# Patient Record
Sex: Female | Born: 1955 | Hispanic: No | Marital: Married | Smoking: Former smoker
Health system: Southern US, Community
[De-identification: ages and names within clinical notes are randomized; demographics above are authoritative.]

## PROBLEM LIST (undated history)

## (undated) DIAGNOSIS — F329 Major depressive disorder, single episode, unspecified: Secondary | ICD-10-CM

## (undated) DIAGNOSIS — F32A Depression, unspecified: Secondary | ICD-10-CM

## (undated) DIAGNOSIS — K219 Gastro-esophageal reflux disease without esophagitis: Secondary | ICD-10-CM

## (undated) DIAGNOSIS — E039 Hypothyroidism, unspecified: Secondary | ICD-10-CM

## (undated) HISTORY — DX: Gastro-esophageal reflux disease without esophagitis: K21.9

## (undated) HISTORY — DX: Depression, unspecified: F32.A

## (undated) HISTORY — DX: Major depressive disorder, single episode, unspecified: F32.9

## (undated) HISTORY — DX: Hypothyroidism, unspecified: E03.9

## (undated) HISTORY — PX: CHOLECYSTECTOMY: SHX55

---

## 1997-08-17 ENCOUNTER — Other Ambulatory Visit: Admission: RE | Admit: 1997-08-17 | Discharge: 1997-08-17 | Payer: Self-pay | Admitting: Obstetrics and Gynecology

## 2005-07-11 ENCOUNTER — Other Ambulatory Visit: Admission: RE | Admit: 2005-07-11 | Discharge: 2005-07-11 | Payer: Self-pay | Admitting: Family Medicine

## 2011-12-15 ENCOUNTER — Other Ambulatory Visit: Payer: Self-pay | Admitting: Otolaryngology

## 2011-12-15 DIAGNOSIS — H903 Sensorineural hearing loss, bilateral: Secondary | ICD-10-CM

## 2011-12-15 DIAGNOSIS — H905 Unspecified sensorineural hearing loss: Secondary | ICD-10-CM

## 2012-12-09 ENCOUNTER — Other Ambulatory Visit: Payer: Self-pay | Admitting: Nurse Practitioner

## 2012-12-11 NOTE — Telephone Encounter (Signed)
Last seen 11/18/11 MMM

## 2012-12-31 ENCOUNTER — Encounter: Payer: Self-pay | Admitting: Nurse Practitioner

## 2012-12-31 ENCOUNTER — Ambulatory Visit (INDEPENDENT_AMBULATORY_CARE_PROVIDER_SITE_OTHER): Payer: BC Managed Care – PPO | Admitting: Nurse Practitioner

## 2012-12-31 VITALS — BP 116/64 | HR 72 | Temp 97.8°F | Ht 62.5 in | Wt 155.0 lb

## 2012-12-31 DIAGNOSIS — K219 Gastro-esophageal reflux disease without esophagitis: Secondary | ICD-10-CM

## 2012-12-31 DIAGNOSIS — Z0000 Encounter for general adult medical examination without abnormal findings: Secondary | ICD-10-CM

## 2012-12-31 DIAGNOSIS — F32A Depression, unspecified: Secondary | ICD-10-CM | POA: Insufficient documentation

## 2012-12-31 DIAGNOSIS — N390 Urinary tract infection, site not specified: Secondary | ICD-10-CM

## 2012-12-31 DIAGNOSIS — R609 Edema, unspecified: Secondary | ICD-10-CM

## 2012-12-31 DIAGNOSIS — F411 Generalized anxiety disorder: Secondary | ICD-10-CM

## 2012-12-31 DIAGNOSIS — F329 Major depressive disorder, single episode, unspecified: Secondary | ICD-10-CM | POA: Insufficient documentation

## 2012-12-31 DIAGNOSIS — Z01419 Encounter for gynecological examination (general) (routine) without abnormal findings: Secondary | ICD-10-CM

## 2012-12-31 DIAGNOSIS — Z124 Encounter for screening for malignant neoplasm of cervix: Secondary | ICD-10-CM

## 2012-12-31 LAB — POCT UA - MICROSCOPIC ONLY
Mucus, UA: NEGATIVE
Yeast, UA: NEGATIVE

## 2012-12-31 LAB — POCT URINALYSIS DIPSTICK
Nitrite, UA: NEGATIVE
Urobilinogen, UA: NEGATIVE
pH, UA: 5

## 2012-12-31 LAB — POCT CBC
HCT, POC: 42.5 % (ref 37.7–47.9)
MCHC: 33.1 g/dL (ref 31.8–35.4)
MCV: 90.7 fL (ref 80–97)
POC Granulocyte: 5.7 (ref 2–6.9)
RDW, POC: 12.9 %
WBC: 0.7 10*3/uL — AB (ref 4.6–10.2)

## 2012-12-31 MED ORDER — NITROFURANTOIN MONOHYD MACRO 100 MG PO CAPS: 100.0000 mg | ORAL_CAPSULE | Freq: Two times a day (BID) | ORAL | Status: DC

## 2012-12-31 MED ORDER — SPIRONOLACTONE 25 MG PO TABS: 25.0000 mg | ORAL_TABLET | Freq: Every day | ORAL | Status: DC

## 2012-12-31 MED ORDER — BUPROPION HCL ER (SR) 150 MG PO TB12: 150.0000 mg | ORAL_TABLET | Freq: Every day | ORAL | Status: DC

## 2012-12-31 MED ORDER — OMEPRAZOLE 20 MG PO CPDR: 20.0000 mg | DELAYED_RELEASE_CAPSULE | Freq: Every day | ORAL | Status: DC

## 2012-12-31 MED ORDER — CITALOPRAM HYDROBROMIDE 40 MG PO TABS: 40.0000 mg | ORAL_TABLET | Freq: Every day | ORAL | Status: DC

## 2012-12-31 NOTE — Progress Notes (Signed)
Subjective:    Patient ID: Candace Torres, female    DOB: 1955/06/09, 57 y.o.   MRN: [id number]  HPI Patient here today for CPE and PAP- She is doing quite well and has no complaints today. Patient Active Problem List   Diagnosis Date Noted  . GERD (gastroesophageal reflux disease) 12/31/2012  . Depression 12/31/2012  . GAD (generalized anxiety disorder) 12/31/2012  . Peripheral edema 12/31/2012   Outpatient Encounter Prescriptions as of 12/31/2012  Medication Sig Dispense Refill  . buPROPion (WELLBUTRIN SR) 150 MG 12 hr tablet Take 150 mg by mouth daily.      . citalopram (CELEXA) 40 MG tablet Take 40 mg by mouth daily.      Marland Kitchen omeprazole (PRILOSEC) 20 MG capsule Take 20 mg by mouth daily.      Marland Kitchen spironolactone (ALDACTONE) 25 MG tablet TAKE ONE TABLET BY MOUTH TWICE DAILY  60 tablet  0   No facility-administered encounter medications on file as of 12/31/2012.       Review of Systems  Constitutional: Negative.   HENT: Negative.   Eyes: Negative.   Respiratory: Negative.   Cardiovascular: Negative.   Gastrointestinal: Negative.   Endocrine: Negative.   Genitourinary: Negative.   Musculoskeletal: Negative.   Neurological: Negative.   Hematological: Negative.   Psychiatric/Behavioral: Negative.   All other systems reviewed and are negative.       Objective:   Physical Exam  Constitutional: She is oriented to person, place, and time. She appears well-developed and well-nourished.  HENT:  Head: Normocephalic.  Right Ear: Hearing, tympanic membrane, external ear and ear canal normal.  Left Ear: Hearing, tympanic membrane, external ear and ear canal normal.  Nose: Nose normal.  Mouth/Throat: Uvula is midline and oropharynx is clear and moist.  Eyes: Conjunctivae and EOM are normal. Pupils are equal, round, and reactive to light.  Neck: Normal range of motion and full passive range of motion without pain. Neck supple. No JVD present. Carotid bruit is not present. No mass and  no thyromegaly present.  Cardiovascular: Normal rate, normal heart sounds and intact distal pulses.   No murmur heard. Pulmonary/Chest: Effort normal and breath sounds normal. Right breast exhibits no inverted nipple, no mass, no nipple discharge, no skin change and no tenderness. Left breast exhibits no inverted nipple, no mass, no nipple discharge, no skin change and no tenderness. Breasts are symmetrical.  Abdominal: Soft. Bowel sounds are normal. She exhibits no mass. There is no tenderness.  Genitourinary: Vagina normal and uterus normal. Guaiac negative stool. No breast swelling, tenderness, discharge or bleeding.  bimanual exam-No adnexal masses or tenderness.  Cervix parous and pink- no discharge  Musculoskeletal: Normal range of motion.  Lymphadenopathy:    She has no cervical adenopathy.  Neurological: She is alert and oriented to person, place, and time.  Skin: Skin is warm and dry.  Psychiatric: She has a normal mood and affect. Her behavior is normal. Judgment and thought content normal.   BP 116/64  Pulse 72  Temp(Src) 97.8 F (36.6 C) (Oral)  Ht 5' 2.5" (1.588 m)  Wt 155 lb (70.308 kg)  BMI 27.88 kg/m2  .      Assessment & Plan:   1. Annual physical exam   2. GERD (gastroesophageal reflux disease)   3. Depression   4. GAD (generalized anxiety disorder)   5. Peripheral edema   6. Encounter for routine gynecological examination   7. UTI (urinary tract infection)    Orders Placed This Encounter  Procedures  . Urine culture  . CMP14+EGFR  . NMR, lipoprofile  . Thyroid Panel With TSH  . Vit D  25 hydroxy (rtn osteoporosis monitoring)  . Urinalysis Dipstick  . POCT CBC  . POCT UA - Microscopic Only   Meds ordered this encounter  Medications  . DISCONTD: buPROPion (WELLBUTRIN SR) 150 MG 12 hr tablet    Sig: Take 150 mg by mouth daily.  Marland Kitchen DISCONTD: citalopram (CELEXA) 40 MG tablet    Sig: Take 40 mg by mouth daily.  Marland Kitchen DISCONTD: omeprazole (PRILOSEC) 20 MG  capsule    Sig: Take 20 mg by mouth daily.  Marland Kitchen spironolactone (ALDACTONE) 25 MG tablet    Sig: Take 1 tablet (25 mg total) by mouth daily.    Dispense:  30 tablet    Refill:  5    Order Specific Question:  Supervising Provider    Answer:  Ernestina Penna [[id number]]  . citalopram (CELEXA) 40 MG tablet    Sig: Take 1 tablet (40 mg total) by mouth daily.    Dispense:  30 tablet    Refill:  5    Order Specific Question:  Supervising Provider    Answer:  Ernestina Penna [[id number]]  . omeprazole (PRILOSEC) 20 MG capsule    Sig: Take 1 capsule (20 mg total) by mouth daily.    Dispense:  90 capsule    Refill:  1    Order Specific Question:  Supervising Provider    Answer:  Ernestina Penna [[id number]]  . buPROPion (WELLBUTRIN SR) 150 MG 12 hr tablet    Sig: Take 1 tablet (150 mg total) by mouth daily.    Dispense:  30 tablet    Refill:  5    Order Specific Question:  Supervising Provider    Answer:  Ernestina Penna [[id number]]  . nitrofurantoin, macrocrystal-monohydrate, (MACROBID) 100 MG capsule    Sig: Take 1 capsule (100 mg total) by mouth 2 (two) times daily.    Dispense:  14 capsule    Refill:  0    Order Specific Question:  Supervising Provider    Answer:  Deborra Medina   Force fluids AZO OTC if helps Continue all meds Labs pending Diet and exercise encouraged Health maintenance reviewed Follow up in 6 months  Mary-Margaret Daphine Deutscher, FNP

## 2012-12-31 NOTE — Patient Instructions (Signed)

## 2013-01-01 LAB — NMR, LIPOPROFILE
HDL Particle Number: 28.7 umol/L — ABNORMAL LOW (ref >=30.5)
LDLC SERPL CALC-MCNC: 110 mg/dL — ABNORMAL HIGH (ref <100)
LP-IR Score: 51 — ABNORMAL HIGH (ref <=45)

## 2013-01-01 LAB — THYROID PANEL WITH TSH: TSH: 1.9 u[IU]/mL (ref 0.450–4.500)

## 2013-01-01 LAB — CMP14+EGFR
ALT: 41 IU/L — ABNORMAL HIGH (ref 0–32)
AST: 27 IU/L (ref 0–40)
Alkaline Phosphatase: 73 IU/L (ref 39–117)
BUN/Creatinine Ratio: 14 (ref 9–23)
CO2: 22 mmol/L (ref 18–29)
Chloride: 101 mmol/L (ref 97–108)
Creatinine, Ser: 1.18 mg/dL — ABNORMAL HIGH (ref 0.57–1.00)
Globulin, Total: 2.2 g/dL (ref 1.5–4.5)
Potassium: 4.4 mmol/L (ref 3.5–5.2)
Sodium: 141 mmol/L (ref 134–144)

## 2013-01-03 LAB — PAP IG W/ RFLX HPV ASCU: PAP Smear Comment: 0

## 2013-01-05 LAB — URINE CULTURE

## 2013-06-27 ENCOUNTER — Encounter: Payer: Self-pay | Admitting: Physician Assistant

## 2013-06-27 ENCOUNTER — Ambulatory Visit (INDEPENDENT_AMBULATORY_CARE_PROVIDER_SITE_OTHER): Payer: BC Managed Care – PPO | Admitting: Physician Assistant

## 2013-06-27 VITALS — BP 119/61 | HR 90 | Temp 99.5°F | Ht 62.5 in | Wt 158.0 lb

## 2013-06-27 DIAGNOSIS — R21 Rash and other nonspecific skin eruption: Secondary | ICD-10-CM

## 2013-06-27 DIAGNOSIS — W57XXXA Bitten or stung by nonvenomous insect and other nonvenomous arthropods, initial encounter: Secondary | ICD-10-CM

## 2013-06-27 DIAGNOSIS — T148 Other injury of unspecified body region: Secondary | ICD-10-CM

## 2013-06-27 MED ORDER — TRIAMCINOLONE ACETONIDE 0.1 % EX CREA: 1.0000 "application " | TOPICAL_CREAM | Freq: Two times a day (BID) | CUTANEOUS | Status: DC

## 2013-06-27 MED ORDER — HYDROXYZINE HCL 25 MG PO TABS: ORAL_TABLET | ORAL | Status: DC

## 2013-06-27 MED ORDER — DOXYCYCLINE HYCLATE 100 MG PO TABS: 100.0000 mg | ORAL_TABLET | Freq: Two times a day (BID) | ORAL | Status: DC

## 2013-07-12 NOTE — Progress Notes (Signed)
   Subjective:    Patient ID: Candace Torres, female    DOB: [birth year], 58 y.o.   MRN: [id number]  HPI 58 y/o female presents with c/o intermittently pruritic rash that began yesterday after removing a tick from her abdomen. Rash is primarily on abdomen and flank region, chest and BUE. Has tried Benadryl and Claritin with no relief. Denies change in detergent, soap, lotion, fabric softener.     Review of Systems Denies CP, SOB, n/v, diarrhea, headache, dizziness, tingling or pain, fever, blurred/double vision Positive for pruritis, rash on trunk and BUE x 1 day    Objective:   Physical Exam - A &O x 3 - erythematous macular rash primarily on right abdomen/flank, BUE and chest.  Skin is dry and flaking. Small raised erythematous papule where tick was removed on abdomen. No streaking or TTP. - Patient recently had skin cancer treated on her leg and has been taking Bactrim q day.          Assessment & Plan:  1. Rash secondary to medication or tick bite: Advised patient to stop taking remaining Bactrim. Prescribed Doxycycline 100 mg BID x 14 days to cover possible.RMSF or [place]. TAC .1% for pruritis. Take Zyrtec 10mg  in am. Hydroxyzine 25mg  at bedtime if needed for pruritis. Use dove soap. RTC if s/s worsen or do not improve.

## 2013-07-13 DIAGNOSIS — R21 Rash and other nonspecific skin eruption: Secondary | ICD-10-CM | POA: Insufficient documentation

## 2013-08-05 ENCOUNTER — Other Ambulatory Visit: Payer: Self-pay | Admitting: Nurse Practitioner

## 2013-12-10 DIAGNOSIS — Z0289 Encounter for other administrative examinations: Secondary | ICD-10-CM

## 2014-01-03 LAB — HM DEXA SCAN: HM Dexa Scan: NORMAL

## 2014-01-08 ENCOUNTER — Ambulatory Visit (INDEPENDENT_AMBULATORY_CARE_PROVIDER_SITE_OTHER): Payer: BC Managed Care – PPO

## 2014-01-08 ENCOUNTER — Encounter: Payer: BC Managed Care – PPO | Admitting: Nurse Practitioner

## 2014-01-08 ENCOUNTER — Encounter: Payer: Self-pay | Admitting: Nurse Practitioner

## 2014-01-08 ENCOUNTER — Encounter (INDEPENDENT_AMBULATORY_CARE_PROVIDER_SITE_OTHER): Payer: Self-pay

## 2014-01-08 ENCOUNTER — Ambulatory Visit (INDEPENDENT_AMBULATORY_CARE_PROVIDER_SITE_OTHER): Payer: BC Managed Care – PPO | Admitting: Nurse Practitioner

## 2014-01-08 VITALS — BP 116/68 | HR 81 | Temp 98.7°F | Ht 62.5 in | Wt 157.0 lb

## 2014-01-08 DIAGNOSIS — F329 Major depressive disorder, single episode, unspecified: Secondary | ICD-10-CM

## 2014-01-08 DIAGNOSIS — Z0000 Encounter for general adult medical examination without abnormal findings: Secondary | ICD-10-CM

## 2014-01-08 DIAGNOSIS — K219 Gastro-esophageal reflux disease without esophagitis: Secondary | ICD-10-CM

## 2014-01-08 DIAGNOSIS — F411 Generalized anxiety disorder: Secondary | ICD-10-CM

## 2014-01-08 DIAGNOSIS — L658 Other specified nonscarring hair loss: Secondary | ICD-10-CM

## 2014-01-08 DIAGNOSIS — R300 Dysuria: Secondary | ICD-10-CM

## 2014-01-08 DIAGNOSIS — Z23 Encounter for immunization: Secondary | ICD-10-CM

## 2014-01-08 DIAGNOSIS — R609 Edema, unspecified: Secondary | ICD-10-CM

## 2014-01-08 DIAGNOSIS — B3749 Other urogenital candidiasis: Secondary | ICD-10-CM

## 2014-01-08 DIAGNOSIS — N3000 Acute cystitis without hematuria: Secondary | ICD-10-CM

## 2014-01-08 DIAGNOSIS — F32A Depression, unspecified: Secondary | ICD-10-CM

## 2014-01-08 LAB — POCT URINALYSIS DIPSTICK
BILIRUBIN UA: NEGATIVE
Glucose, UA: NEGATIVE
Ketones, UA: NEGATIVE
Nitrite, UA: NEGATIVE
PH UA: 6.5
Spec Grav, UA: 1.015
Urobilinogen, UA: NEGATIVE

## 2014-01-08 LAB — POCT CBC
Granulocyte percent: 66.3 %G (ref 37–80)
HEMATOCRIT: 43.2 % (ref 37.7–47.9)
Hemoglobin: 13.9 g/dL (ref 12.2–16.2)
Lymph, poc: 2.3 (ref 0.6–3.4)
MCH, POC: 29.8 pg (ref 27–31.2)
MCHC: 32.3 g/dL (ref 31.8–35.4)
MCV: 92.4 fL (ref 80–97)
MPV: 8 fL (ref 0–99.8)
POC GRANULOCYTE: 6.2 (ref 2–6.9)
POC LYMPH PERCENT: 25.2 %L (ref 10–50)
Platelet Count, POC: 259 10*3/uL (ref 142–424)
RBC: 4.7 M/uL (ref 4.04–5.48)
RDW, POC: 12.4 %
WBC: 9.3 10*3/uL (ref 4.6–10.2)

## 2014-01-08 LAB — POCT UA - MICROSCOPIC ONLY
Casts, Ur, LPF, POC: NEGATIVE
Crystals, Ur, HPF, POC: NEGATIVE
MUCUS UA: NEGATIVE
Yeast, UA: NEGATIVE

## 2014-01-08 MED ORDER — SPIRONOLACTONE 25 MG PO TABS: 25.0000 mg | ORAL_TABLET | Freq: Every day | ORAL | Status: DC

## 2014-01-08 MED ORDER — PAROXETINE HCL ER 25 MG PO TB24: 25.0000 mg | ORAL_TABLET | Freq: Every day | ORAL | Status: DC

## 2014-01-08 MED ORDER — BUPROPION HCL ER (SR) 150 MG PO TB12: 150.0000 mg | ORAL_TABLET | Freq: Every day | ORAL | Status: DC

## 2014-01-08 MED ORDER — OMEPRAZOLE 20 MG PO CPDR: DELAYED_RELEASE_CAPSULE | ORAL | Status: DC

## 2014-01-08 MED ORDER — SULFAMETHOXAZOLE-TMP DS 800-160 MG PO TABS: 1.0000 | ORAL_TABLET | Freq: Two times a day (BID) | ORAL | Status: DC

## 2014-01-08 NOTE — Progress Notes (Addendum)
Subjective:    Patient ID: Candace Torres, female    DOB: [birth year], 58 y.o.   MRN: [id number]  HPI Patient here today for annual physical exam- she is doing well today. Her only complaint is of dysuria that started couple of days ago. Denies fever or nocturia. Hair loss Currently takes spirolactone which helps with her hair loss GERD Omeprazole working well- no symptoms when she takes meds Depression/GAD Paxil and wellbutrin combinations working well- keeps her calm and from worrying so much  Review of Systems  Constitutional: Negative.   HENT: Negative.   Respiratory: Negative.   Cardiovascular: Negative.   Gastrointestinal: Negative.   Genitourinary: Positive for dysuria, urgency and frequency. Negative for pelvic pain.  Neurological: Negative.   Psychiatric/Behavioral: Negative.   All other systems reviewed and are negative.      Objective:   Physical Exam  Constitutional: She is oriented to person, place, and time. She appears well-developed and well-nourished.  HENT:  Head: Normocephalic.  Right Ear: Hearing, tympanic membrane, external ear and ear canal normal.  Left Ear: Hearing, tympanic membrane, external ear and ear canal normal.  Nose: Nose normal.  Mouth/Throat: Uvula is midline and oropharynx is clear and moist.  Eyes: Conjunctivae and EOM are normal. Pupils are equal, round, and reactive to light.  Neck: Normal range of motion and full passive range of motion without pain. Neck supple. No JVD present. Carotid bruit is not present. No mass and no thyromegaly present.  Cardiovascular: Normal rate, normal heart sounds and intact distal pulses.   No murmur heard. Pulmonary/Chest: Effort normal and breath sounds normal.  Abdominal: Soft. Bowel sounds are normal. She exhibits no mass. There is no tenderness.  Genitourinary: No breast swelling, tenderness, discharge or bleeding.  Musculoskeletal: Normal range of motion.  Lymphadenopathy:    She has no cervical  adenopathy.  Neurological: She is alert and oriented to person, place, and time.  Skin: Skin is warm and dry.  Psychiatric: She has a normal mood and affect. Her behavior is normal. Judgment and thought content normal.   BP 116/68  Pulse 81  Temp(Src) 98.7 F (37.1 C) (Oral)  Ht 5' 2.5" (1.588 m)  Wt 157 lb (71.215 kg)  BMI 28.24 kg/m2  Results for orders placed in visit on 01/08/14  HM DEXA SCAN      Result Value Ref Range   HM Dexa Scan normal    POCT URINALYSIS DIPSTICK      Result Value Ref Range   Color, UA gold     Clarity, UA clear     Glucose, UA neg     Bilirubin, UA neg     Ketones, UA neg     Spec Grav, UA 1.015     Blood, UA large     pH, UA 6.5     Protein, UA 2+     Urobilinogen, UA negative     Nitrite, UA neg     Leukocytes, UA large (3+)            Assessment & Plan:  1. Dysuria - POCT UA - Microscopic Only - POCT urinalysis dipstick  2. Peripheral edema  3. Gastroesophageal reflux disease without esophagitis - omeprazole (PRILOSEC) 20 MG capsule; TAKE ONE CAPSULE BY MOUTH ONE TIME DAILY  Dispense: 90 capsule; Refill: 1  4. GAD (generalized anxiety disorder)  5. Depression - PARoxetine (PAXIL-CR) 25 MG 24 hr tablet; Take 1 tablet (25 mg total) by mouth daily.  Dispense: 90 tablet;  Refill: 3 - buPROPion (WELLBUTRIN SR) 150 MG 12 hr tablet; Take 1 tablet (150 mg total) by mouth daily.  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  Dispense: 90 tablet; Refill: 1  7. Annual physical exam - DG Chest 2 View; Future - EKG 12-Lead - POCT CBC - CMP14+EGFR - NMR, lipoprofile - Thyroid Panel With TSH - Vit D  25 hydroxy (rtn osteoporosis monitoring)  8. Acute cystitis without hematuria - sulfamethoxazole-trimethoprim (BACTRIM DS) 800-160 MG per tablet; Take 1 tablet by mouth 2 (two) times daily.  Dispense: 20 tablet; Refill: 0    Labs  pending Health maintenance reviewed Diet and exercise encouraged Continue all meds Follow up  In 3 month   [place], FNP

## 2014-01-08 NOTE — Addendum Note (Signed)
Addended by: Selmer Dominion on: 01/08/2014 12:03 PM   Modules accepted: Orders

## 2014-01-09 LAB — CMP14+EGFR
A/G RATIO: 2 (ref 1.1–2.5)
ALBUMIN: 4.7 g/dL (ref 3.6–4.8)
ALT: 37 IU/L — AB (ref 0–32)
AST: 31 IU/L (ref 0–40)
Alkaline Phosphatase: 86 IU/L (ref 39–117)
BILIRUBIN TOTAL: 0.6 mg/dL (ref 0.0–1.2)
BUN/Creatinine Ratio: 13 (ref 11–26)
BUN: 16 mg/dL (ref 8–27)
CALCIUM: 9.7 mg/dL (ref 8.7–10.3)
CO2: 22 mmol/L (ref 18–29)
CREATININE: 1.23 mg/dL — AB (ref 0.57–1.00)
Chloride: 101 mmol/L (ref 97–108)
GFR calc non Af Amer: 48 mL/min/{1.73_m2} — ABNORMAL LOW (ref >59)
GFR, EST AFRICAN AMERICAN: 55 mL/min/{1.73_m2} — AB (ref >59)
GLOBULIN, TOTAL: 2.4 g/dL (ref 1.5–4.5)
GLUCOSE: 97 mg/dL (ref 65–99)
Potassium: 4.7 mmol/L (ref 3.5–5.2)
Sodium: 141 mmol/L (ref 134–144)
Total Protein: 7.1 g/dL (ref 6.0–8.5)

## 2014-01-09 LAB — THYROID PANEL WITH TSH
FREE THYROXINE INDEX: 1.9 (ref 1.2–4.9)
T3 UPTAKE RATIO: 24 % (ref 24–39)
T4, Total: 8 ug/dL (ref 4.5–12.0)
TSH: 1.31 u[IU]/mL (ref 0.450–4.500)

## 2014-01-09 LAB — VITAMIN D 25 HYDROXY (VIT D DEFICIENCY, FRACTURES): Vit D, 25-Hydroxy: 48 ng/mL (ref 30.0–100.0)

## 2014-01-09 LAB — NMR, LIPOPROFILE
Cholesterol: 182 mg/dL (ref 100–199)
HDL CHOLESTEROL BY NMR: 37 mg/dL — AB (ref >39)
HDL PARTICLE NUMBER: 24.5 umol/L — AB (ref >=30.5)
LDL Particle Number: 1568 nmol/L — ABNORMAL HIGH (ref <1000)
LDL Size: 21.3 nm (ref >20.5)
LDLC SERPL CALC-MCNC: 124 mg/dL — ABNORMAL HIGH (ref 0–99)
LP-IR SCORE: 56 — AB (ref <=45)
Small LDL Particle Number: 686 nmol/L — ABNORMAL HIGH (ref <=527)
TRIGLYCERIDES BY NMR: 107 mg/dL (ref 0–149)

## 2014-01-10 LAB — URINE CULTURE

## 2014-01-13 ENCOUNTER — Other Ambulatory Visit: Payer: BC Managed Care – PPO

## 2014-01-14 ENCOUNTER — Telehealth: Payer: Self-pay | Admitting: Family Medicine

## 2014-01-14 LAB — FECAL OCCULT BLOOD, IMMUNOCHEMICAL: Fecal Occult Bld: NEGATIVE

## 2014-01-14 NOTE — Telephone Encounter (Signed)
Message copied by Waverly Ferrari on Wed Jan 14, 2014  9:33 AM ------      Message from: Chevis Pretty      Created: Fri Jan 09, 2014  8:40 AM       Cbc normal      Kidney and liver function stable      Creatine up slightly- avoid NSAIDS      LDL particle numbers and LDL are elevated- Low CVA risk so try low fat diet and recheck in 3 months      Thyroid panel normal      Vitamin d  Level normal      UTI- treated at appointment ------

## 2014-01-26 ENCOUNTER — Encounter: Payer: Self-pay | Admitting: *Deleted

## 2014-09-30 ENCOUNTER — Encounter: Payer: Self-pay | Admitting: Nurse Practitioner

## 2014-12-16 ENCOUNTER — Ambulatory Visit: Payer: Self-pay | Admitting: Physician Assistant

## 2014-12-22 ENCOUNTER — Other Ambulatory Visit: Payer: Self-pay | Admitting: Nurse Practitioner

## 2014-12-22 NOTE — Telephone Encounter (Signed)
Not seen since 01/2014

## 2014-12-22 NOTE — Telephone Encounter (Signed)
no more refills without being seen  

## 2014-12-22 NOTE — Telephone Encounter (Signed)
Detailed message left for patient that she will need to be seen for any further refills. 

## 2015-04-28 ENCOUNTER — Ambulatory Visit: Payer: Self-pay | Admitting: Family Medicine

## 2015-04-28 ENCOUNTER — Ambulatory Visit: Payer: Self-pay

## 2015-04-29 ENCOUNTER — Encounter: Payer: Self-pay | Admitting: Nurse Practitioner

## 2015-05-21 ENCOUNTER — Encounter: Payer: Self-pay | Admitting: Nurse Practitioner

## 2015-06-30 ENCOUNTER — Telehealth: Payer: Self-pay | Admitting: Nurse Practitioner

## 2015-07-08 ENCOUNTER — Encounter: Payer: Self-pay | Admitting: Family Medicine

## 2015-07-08 ENCOUNTER — Encounter (INDEPENDENT_AMBULATORY_CARE_PROVIDER_SITE_OTHER): Payer: Self-pay

## 2015-07-08 ENCOUNTER — Ambulatory Visit (INDEPENDENT_AMBULATORY_CARE_PROVIDER_SITE_OTHER): Payer: BLUE CROSS/BLUE SHIELD | Admitting: Family Medicine

## 2015-07-08 VITALS — BP 119/68 | HR 82 | Temp 97.4°F | Ht 62.5 in | Wt 161.4 lb

## 2015-07-08 DIAGNOSIS — N183 Chronic kidney disease, stage 3 unspecified: Secondary | ICD-10-CM

## 2015-07-08 DIAGNOSIS — Z0000 Encounter for general adult medical examination without abnormal findings: Secondary | ICD-10-CM | POA: Insufficient documentation

## 2015-07-08 DIAGNOSIS — R7989 Other specified abnormal findings of blood chemistry: Secondary | ICD-10-CM

## 2015-07-08 DIAGNOSIS — R946 Abnormal results of thyroid function studies: Secondary | ICD-10-CM

## 2015-07-08 LAB — URINALYSIS, COMPLETE
Bilirubin, UA: NEGATIVE
GLUCOSE, UA: NEGATIVE
Ketones, UA: NEGATIVE
Nitrite, UA: NEGATIVE
PH UA: 7 (ref 5.0–7.5)
Specific Gravity, UA: 1.02 (ref 1.005–1.030)
Urobilinogen, Ur: 1 mg/dL (ref 0.2–1.0)

## 2015-07-08 LAB — MICROSCOPIC EXAMINATION

## 2015-07-08 NOTE — Addendum Note (Signed)
Addended by: Timmothy Euler on: 07/08/2015 09:02 AM   Modules accepted: Level of Service

## 2015-07-08 NOTE — Addendum Note (Signed)
Addended by: Timmothy Euler on: 07/08/2015 12:10 PM   Modules accepted: Orders

## 2015-07-08 NOTE — Progress Notes (Addendum)
   HPI  Patient presents today here for lab review.  Patient had recent labs done at her place of employment and was found to have elevated creatinine and reduced GFR.  Her creatinine is 1.45, her GFR is 39. Also her TSH was elevated 5.57.    PMH: Smoking status noted ROS: Per HPI  Objective: BP 119/68 mmHg  Pulse 82  Temp(Src) 97.4 F (36.3 C) (Oral)  Ht 5' 2.5" (1.588 m)  Wt 161 lb 6.4 oz (73.211 kg)  BMI 29.03 kg/m2 Gen: NAD, alert, cooperative with exam HEENT: NCAT, EOMI, PERRL CV: RRR, good S1/S2, no murmur Resp: CTABL, no wheezes, non-labored Abd: SNTND, BS present, no guarding or organomegaly Ext: No edema, warm Neuro: Alert and oriented, No gross deficits  Labs from 06/07/2015. Glucose 104, nonfasting Creatinine 1.45 GFR 39 Sodium 140 Potassium 4.3 AST 29 ALT 34 Bilirubin 0.4  TSH 5.57   Assessment and plan:  # Chronic kidney disease stage III Discussed with patient, discussed avoiding NSAIDs, plenty of fluids Urinalysis and micro-, urine protein creatinine ratio, and labs repeated today Discussed renal ultrasound, she will proceed Repeat labs in 5-6 months  # Elevated TSH Discussed hypothyroidism, she has mild symptoms of fatigue Recheck TSH and T4  # HCM Pt declines C scope for now, FOBT given    Orders Placed This Encounter  Procedures  . Urinalysis, Complete  . Protein / Creatinine Ratio, Urine  . Comprehensive metabolic panel  . TSH + free T4    Meds ordered this encounter  Medications  . levothyroxine (SYNTHROID, LEVOTHROID) 25 MCG tablet    Sig: Take 25 mcg by mouth daily before breakfast.    Laroy Apple, MD [facility] 07/08/2015, 8:34 AM

## 2015-07-08 NOTE — Patient Instructions (Addendum)
Great to meet you!  Lets plan to see you back in September to look at labs unless you need [place] sooner.    You technically have CKD stage 3 (chronic kidney disease), for this we do a basic work up to identify reasons we can cahnage, avoid medicines that stress the kidney, and encourage you to drink lots of water.    We will call with labs within 1 week

## 2015-07-09 ENCOUNTER — Other Ambulatory Visit: Payer: Self-pay | Admitting: Family Medicine

## 2015-07-09 LAB — COMPREHENSIVE METABOLIC PANEL
ALBUMIN: 4.6 g/dL (ref 3.6–4.8)
ALT: 35 IU/L — ABNORMAL HIGH (ref 0–32)
AST: 31 IU/L (ref 0–40)
Albumin/Globulin Ratio: 1.9 (ref 1.2–2.2)
Alkaline Phosphatase: 82 IU/L (ref 39–117)
BUN / CREAT RATIO: 12 (ref 12–28)
BUN: 15 mg/dL (ref 8–27)
Bilirubin Total: 0.6 mg/dL (ref 0.0–1.2)
CHLORIDE: 100 mmol/L (ref 96–106)
CO2: 23 mmol/L (ref 18–29)
Calcium: 9.7 mg/dL (ref 8.7–10.3)
Creatinine, Ser: 1.3 mg/dL — ABNORMAL HIGH (ref 0.57–1.00)
GFR calc non Af Amer: 44 mL/min/{1.73_m2} — ABNORMAL LOW (ref >59)
GFR, EST AFRICAN AMERICAN: 51 mL/min/{1.73_m2} — AB (ref >59)
Globulin, Total: 2.4 g/dL (ref 1.5–4.5)
Glucose: 111 mg/dL — ABNORMAL HIGH (ref 65–99)
Potassium: 4.3 mmol/L (ref 3.5–5.2)
SODIUM: 138 mmol/L (ref 134–144)
TOTAL PROTEIN: 7 g/dL (ref 6.0–8.5)

## 2015-07-09 LAB — TSH+FREE T4
FREE T4: 0.96 ng/dL (ref 0.82–1.77)
TSH: 2.43 u[IU]/mL (ref 0.450–4.500)

## 2015-07-09 LAB — PROTEIN / CREATININE RATIO, URINE
CREATININE, UR: 166.2 mg/dL
Protein, Ur: 22.8 mg/dL
Protein/Creat Ratio: 137 mg/g creat (ref 0–200)

## 2015-07-09 MED ORDER — CEPHALEXIN 500 MG PO CAPS: 500.0000 mg | ORAL_CAPSULE | Freq: Three times a day (TID) | ORAL | Status: DC

## 2015-07-15 ENCOUNTER — Other Ambulatory Visit (HOSPITAL_COMMUNITY): Payer: Self-pay

## 2015-08-11 DIAGNOSIS — F418 Other specified anxiety disorders: Secondary | ICD-10-CM | POA: Diagnosis not present

## 2015-09-13 DIAGNOSIS — Z1231 Encounter for screening mammogram for malignant neoplasm of breast: Secondary | ICD-10-CM | POA: Diagnosis not present

## 2015-09-22 DIAGNOSIS — R944 Abnormal results of kidney function studies: Secondary | ICD-10-CM | POA: Diagnosis not present

## 2015-09-22 DIAGNOSIS — E039 Hypothyroidism, unspecified: Secondary | ICD-10-CM | POA: Diagnosis not present

## 2015-09-27 DIAGNOSIS — E039 Hypothyroidism, unspecified: Secondary | ICD-10-CM | POA: Diagnosis not present

## 2015-09-27 DIAGNOSIS — F418 Other specified anxiety disorders: Secondary | ICD-10-CM | POA: Diagnosis not present

## 2015-09-27 DIAGNOSIS — R944 Abnormal results of kidney function studies: Secondary | ICD-10-CM | POA: Diagnosis not present

## 2015-10-21 ENCOUNTER — Encounter: Payer: Self-pay | Admitting: Family Medicine

## 2016-01-11 ENCOUNTER — Ambulatory Visit (INDEPENDENT_AMBULATORY_CARE_PROVIDER_SITE_OTHER): Payer: BLUE CROSS/BLUE SHIELD | Admitting: Physician Assistant

## 2016-01-11 ENCOUNTER — Encounter: Payer: Self-pay | Admitting: Physician Assistant

## 2016-01-11 VITALS — BP 120/80 | HR 72 | Temp 97.8°F | Ht 62.5 in | Wt 155.2 lb

## 2016-01-11 DIAGNOSIS — L709 Acne, unspecified: Secondary | ICD-10-CM

## 2016-01-11 DIAGNOSIS — Z0000 Encounter for general adult medical examination without abnormal findings: Secondary | ICD-10-CM

## 2016-01-11 DIAGNOSIS — R609 Edema, unspecified: Secondary | ICD-10-CM

## 2016-01-11 DIAGNOSIS — Z6827 Body mass index (BMI) 27.0-27.9, adult: Secondary | ICD-10-CM

## 2016-01-11 DIAGNOSIS — E039 Hypothyroidism, unspecified: Secondary | ICD-10-CM

## 2016-01-11 DIAGNOSIS — R300 Dysuria: Secondary | ICD-10-CM | POA: Diagnosis not present

## 2016-01-11 DIAGNOSIS — F3341 Major depressive disorder, recurrent, in partial remission: Secondary | ICD-10-CM

## 2016-01-11 LAB — URINALYSIS, COMPLETE
Bilirubin, UA: NEGATIVE
GLUCOSE, UA: NEGATIVE
Ketones, UA: NEGATIVE
NITRITE UA: NEGATIVE
SPEC GRAV UA: 1.015 (ref 1.005–1.030)
Urobilinogen, Ur: 1 mg/dL (ref 0.2–1.0)
pH, UA: 6 (ref 5.0–7.5)

## 2016-01-11 LAB — MICROSCOPIC EXAMINATION

## 2016-01-11 MED ORDER — BUPROPION HCL ER (XL) 300 MG PO TB24: 300.0000 mg | ORAL_TABLET | Freq: Every day | ORAL | 0 refills | Status: DC

## 2016-01-11 MED ORDER — DOXYCYCLINE HYCLATE 100 MG PO TABS: 100.0000 mg | ORAL_TABLET | Freq: Two times a day (BID) | ORAL | 6 refills | Status: DC

## 2016-01-11 MED ORDER — LEVOTHYROXINE SODIUM 25 MCG PO TABS
25.0000 ug | ORAL_TABLET | Freq: Every day | ORAL | 3 refills | Status: DC
Start: 2016-01-11 — End: 2017-11-07

## 2016-01-11 MED ORDER — SPIRONOLACTONE 25 MG PO TABS: 25.0000 mg | ORAL_TABLET | Freq: Every day | ORAL | 3 refills | Status: DC

## 2016-01-11 NOTE — Progress Notes (Signed)
BP 120/80   Pulse 72   Temp 97.8 F (36.6 C) (Oral)   Ht 5' 2.5" (1.588 m)   Wt 155 lb 3.2 oz (70.4 kg)   BMI 27.93 kg/m    Subjective:    Patient ID: Candace Torres, female    DOB: 10-20-1955, 60 y.o.   MRN: [id number]  HPI: Candace Torres is a 60 y.o. female presenting on 01/11/2016 for Annual Exam (No pap, Discuss Medications she does not feel that the wellbutrin and zoloft are helping ); Urinary Tract Infection; and Face is breaking out  This patient is here for her annual wellness exam. She has her last performed through her work at [employer] and reports that the nurse practitioner said her work was all normal. She needs a review of her medications and conditions today. She does need refills on some of her meds. She is also having more issues with depression. She is only taking Wellbutrin 150 mg daily we have discussed that she can increase to 300 mg. We will have a trial of this and plan to see her back in a month  She is also having some urinary tract symptoms. She has had issues of recurrent UTIs in the past. She has had urology evaluation and there was no abnormality found. She does use over-the-counter medications that help with bladder spasms.  She has had a significant increase of acne outbreak on her face. She also has a seborrheic keratosis on her left cheek. She is making a dermatology appointment for that. We had a long discussion about the treatment of acne and what she can do to improve her face hygiene.   Relevant past medical, surgical, family and social history reviewed and updated as indicated. Interim medical history since our last visit reviewed. Allergies and medications reviewed and updated. DATA REVIEWED: CHART IN EPIC  Social History   Social History  . Marital status: Married    Spouse name: N/A  . Number of children: N/A  . Years of education: N/A   Occupational History  . Not on file.   Social History Main Topics  . Smoking status: Former Research scientist (life sciences)  .  Smokeless tobacco: Never Used  . Alcohol use No  . Drug use: No  . Sexual activity: Not on file   Other Topics Concern  . Not on file   Social History Narrative  . No narrative on file    Past Surgical History:  Procedure Laterality Date  . CHOLECYSTECTOMY      Family History  Problem Relation Age of Onset  . Depression Mother   . Hypertension Mother   . COPD Father     Review of Systems  Constitutional: Negative.  Negative for activity change, fatigue and fever.  HENT: Negative.   Eyes: Negative.   Respiratory: Negative.  Negative for cough.   Cardiovascular: Negative.  Negative for chest pain.  Gastrointestinal: Negative.  Negative for abdominal pain.  Endocrine: Negative.   Genitourinary: Positive for difficulty urinating, hematuria and urgency. Negative for dysuria, vaginal discharge and vaginal pain.  Musculoskeletal: Negative.   Skin: Positive for color change, rash and wound.  Neurological: Negative.   Psychiatric/Behavioral: Positive for decreased concentration and dysphoric mood. Negative for sleep disturbance. The patient is not nervous/anxious.       Medication List       Accurate as of 01/11/16 12:57 PM. Always use your most recent med list.          buPROPion 300 MG 24  hr tablet Commonly known as:  WELLBUTRIN XL Take 1 tablet (300 mg total) by mouth daily.   doxycycline 100 MG tablet Commonly known as:  VIBRA-TABS Take 1 tablet (100 mg total) by mouth 2 (two) times daily. After 10 days, take one daily as maintenance   hydrOXYzine 25 MG tablet Commonly known as:  ATARAX/VISTARIL Take one tablet at bedtime for itching   levothyroxine 25 MCG tablet Commonly known as:  SYNTHROID, LEVOTHROID Take 1 tablet (25 mcg total) by mouth daily before breakfast.   omeprazole 20 MG capsule Commonly known as:  PRILOSEC TAKE ONE CAPSULE BY MOUTH ONE TIME DAILY   PARoxetine 25 MG 24 hr tablet Commonly known as:  PAXIL-CR Take 1 tablet (25 mg total) by  mouth daily.   spironolactone 25 MG tablet Commonly known as:  ALDACTONE Take 1 tablet (25 mg total) by mouth daily.          Objective:    BP 120/80   Pulse 72   Temp 97.8 F (36.6 C) (Oral)   Ht 5' 2.5" (1.588 m)   Wt 155 lb 3.2 oz (70.4 kg)   BMI 27.93 kg/m   No Known Allergies  Wt Readings from Last 3 Encounters:  01/11/16 155 lb 3.2 oz (70.4 kg)  07/08/15 161 lb 6.4 oz (73.2 kg)  01/08/14 157 lb (71.2 kg)    Physical Exam  Constitutional: She is oriented to person, place, and time. She appears well-developed and well-nourished.  HENT:  Head: Normocephalic and atraumatic.  Eyes: Conjunctivae and EOM are normal. Pupils are equal, round, and reactive to light.  Neck: Normal range of motion. Neck supple.  Cardiovascular: Normal rate, regular rhythm, normal heart sounds and intact distal pulses.   Pulmonary/Chest: Effort normal and breath sounds normal.  Abdominal: Soft. Bowel sounds are normal.  Neurological: She is alert and oriented to person, place, and time. She has normal reflexes.  Skin: Skin is warm and dry. Rash noted. Rash is nodular and pustular.     Left cheek with seborrheic keratosis Face with multiple acne lesions, reports pustules  Psychiatric: She has a normal mood and affect. Her behavior is normal. Judgment and thought content normal.    Results for orders placed or performed in visit on 07/08/15  Microscopic Examination  Result Value Ref Range   WBC, UA 6-10 (A) 0 - 5 /hpf   RBC, UA 3-10 (A) 0 - 2 /hpf   Epithelial Cells (non renal) 0-10 0 - 10 /hpf   Renal Epithel, UA 0-10 (A) None seen /hpf   Bacteria, UA Moderate (A) None seen/Few  Urinalysis, Complete  Result Value Ref Range   Specific Gravity, UA 1.020 1.005 - 1.030   pH, UA 7.0 5.0 - 7.5   Color, UA Yellow Yellow   Appearance Ur Clear Clear   Leukocytes, UA 2+ (A) Negative   Protein, UA 1+ (A) Negative/Trace   Glucose, UA Negative Negative   Ketones, UA Negative Negative   RBC,  UA 2+ (A) Negative   Bilirubin, UA Negative Negative   Urobilinogen, Ur 1.0 0.2 - 1.0 mg/dL   Nitrite, UA Negative Negative   Microscopic Examination See below:   Protein / Creatinine Ratio, Urine  Result Value Ref Range   Creatinine, Urine 166.2 Not Estab. mg/dL   Protein, Ur 22.8 Not Estab. mg/dL   Protein/Creat Ratio 137 0 - 200 mg/g creat  Comprehensive metabolic panel  Result Value Ref Range   Glucose 111 (H) 65 - 99 mg/dL  BUN 15 8 - 27 mg/dL   Creatinine, Ser 1.30 (H) 0.57 - 1.00 mg/dL   GFR calc non Af Amer 44 (L) >59 mL/min/1.73   GFR calc Af Amer 51 (L) >59 mL/min/1.73   BUN/Creatinine Ratio 12 12 - 28   Sodium 138 134 - 144 mmol/L   Potassium 4.3 3.5 - 5.2 mmol/L   Chloride 100 96 - 106 mmol/L   CO2 23 18 - 29 mmol/L   Calcium 9.7 8.7 - 10.3 mg/dL   Total Protein 7.0 6.0 - 8.5 g/dL   Albumin 4.6 3.6 - 4.8 g/dL   Globulin, Total 2.4 1.5 - 4.5 g/dL   Albumin/Globulin Ratio 1.9 1.2 - 2.2   Bilirubin Total 0.6 0.0 - 1.2 mg/dL   Alkaline Phosphatase 82 39 - 117 IU/L   AST 31 0 - 40 IU/L   ALT 35 (H) 0 - 32 IU/L  TSH + free T4  Result Value Ref Range   TSH 2.430 0.450 - 4.500 uIU/mL   Free T4 0.96 0.82 - 1.77 ng/dL     PHQ-9 today is   12 today  Anhedonia:   Several days  Mood:  Several days Sleep:  Several days Energy: Several days Appetite: Several days Worthless: More than half Concentrate: More than half Psychomotor: Several days SI:  Not at all  Assessment & Plan:   1. Well adult exam  2. Dysuria - Urinalysis, Complete - Urine culture  3. Recurrent major depressive disorder, in partial remission (HCC) - buPROPion (WELLBUTRIN XL) 300 MG 24 hr tablet; Take 1 tablet (300 mg total) by mouth daily.  Dispense: 90 tablet; Refill: 0 -Paxil 25 mg one daily  4. Hypothyroidism, unspecified type - levothyroxine (SYNTHROID, LEVOTHROID) 25 MCG tablet; Take 1 tablet (25 mcg total) by mouth daily before breakfast.  Dispense: 90 tablet; Refill: 3  5. Edema,  unspecified type - spironolactone (ALDACTONE) 25 MG tablet; Take 1 tablet (25 mg total) by mouth daily.  Dispense: 90 tablet; Refill: 3  6. Acne, unspecified acne type - doxycycline (VIBRA-TABS) 100 MG tablet; Take 1 tablet (100 mg total) by mouth 2 (two) times daily. After 10 days, take one daily as maintenance  Dispense: 40 tablet; Refill: 6  7. Body mass index 27.0-27.9, adult Diet and exercise, patient is very muscular build  8. Healthcare maintenance      Continue all other maintenance medications as listed above.  Follow up plan: Return in about 4 weeks (around 02/08/2016) for recheck meds.   Orders Placed This Encounter  Procedures  . Urine culture  . Urinalysis, Complete    Educational handout given for acne  Terald Sleeper PA-C [place] [address]  [place], [place] [address] [phone]   01/11/2016, 12:57 PM

## 2016-01-11 NOTE — Patient Instructions (Signed)
Acne Acne is a skin problem that causes pimples. Acne occurs when the pores in the skin get blocked. The pores may become infected with bacteria, or they may become red, sore, and swollen. Acne is a common skin problem, especially for teenagers. Acne usually goes away over time. CAUSES Each pore contains an oil gland. Oil glands make an oily substance that is called sebum. Acne happens when these glands get plugged with sebum, dead skin cells, and dirt. Then, the bacteria that are normally found in the oil glands multiply and cause inflammation. Acne is commonly triggered by changes in your hormones. These hormonal changes can cause the oil glands to get bigger and to make more sebum. Factors that can make acne worse include:  Hormone changes during:  Adolescence.  Women's menstrual cycles.  Pregnancy.  Oil-based cosmetics and hair products.  Harshly scrubbing the skin.  Strong soaps.  Stress.  Hormone problems that are due to certain diseases.  Long or oily hair rubbing against the skin.  Certain medicines.  Pressure from headbands, backpacks, or shoulder pads.  Exposure to certain oils and chemicals. RISK FACTORS This condition is more likely to develop in:  Teenagers.  People who have a family history of acne. SYMPTOMS Acne often occurs on the face, neck, chest, and upper back. Symptoms include:  Small, red bumps (pimples or papules).  Whiteheads.  Blackheads.  Small, pus-filled pimples (pustules).  Big, red pimples or pustules that feel tender. More severe acne can cause:  An infected area that contains a collection of pus (abscess).  Hard, painful, fluid-filled sacs (cysts).  Scars. DIAGNOSIS This condition is diagnosed with a medical history and physical exam. Blood tests may also be done. TREATMENT Treatment for this condition can vary depending on the severity of your acne. Treatment may include:  Creams and lotions that prevent oil glands from  clogging.  Creams and lotions that treat or prevent infections and inflammation.  Antibiotic medicines that are applied to the skin or taken as a pill.  Pills that decrease sebum production.  Birth control pills.  Light or laser treatments.  Surgery.  Injections of medicine into the affected areas.  Chemicals that cause peeling of the skin. Your health care provider will also recommend the best way to take care of your skin. Good skin care is the most important part of treatment. HOME CARE INSTRUCTIONS Skin Care Take care of your skin as told by your health care provider. You may be told to do these things:  Wash your skin gently at least two times each day, as well as:  After you exercise.  Before you go to bed.  Use mild soap.  Apply a water-based skin moisturizer after you wash your skin.  Use a sunscreen or sunblock with SPF 30 or greater. This is especially important if you are using acne medicines.  Choose cosmetics that will not plug your oil glands (are noncomedogenic). Medicines  Take over-the-counter and prescription medicines only as told by your health care provider.  If you were prescribed an antibiotic medicine, apply or take it as told by your health care provider. Do not stop taking the antibiotic even if your condition improves. General Instructions  Keep your hair clean and off of your face. If you have oily hair, shampoo your hair regularly or daily.  Avoid leaning your chin or forehead against your hands.  Avoid wearing tight headbands or hats.  Avoid picking or squeezing your pimples. That can make your acne worse  and cause scarring.  Keep all follow-up visits as told by your health care provider. This is important.  Shave gently and only when necessary.  Keep a food journal to figure out if any foods are linked with your acne. SEEK MEDICAL CARE IF:  Your acne is not better after eight weeks.  Your acne gets worse.  You have a large  area of skin that is red or tender.  You think that you are having side effects from any acne medicine.   This information is not intended to replace advice given to you by your health care provider. Make sure you discuss any questions you have with your health care provider.   Document Released: 03/17/2000 Document Revised: 12/09/2014 Document Reviewed: 05/27/2014 Elsevier Interactive Patient Education [employer].

## 2016-01-13 LAB — URINE CULTURE

## 2016-01-26 DIAGNOSIS — E039 Hypothyroidism, unspecified: Secondary | ICD-10-CM | POA: Diagnosis not present

## 2016-01-26 DIAGNOSIS — Z139 Encounter for screening, unspecified: Secondary | ICD-10-CM | POA: Diagnosis not present

## 2016-01-26 DIAGNOSIS — F418 Other specified anxiety disorders: Secondary | ICD-10-CM | POA: Diagnosis not present

## 2016-01-26 DIAGNOSIS — E782 Mixed hyperlipidemia: Secondary | ICD-10-CM | POA: Diagnosis not present

## 2016-01-26 DIAGNOSIS — R944 Abnormal results of kidney function studies: Secondary | ICD-10-CM | POA: Diagnosis not present

## 2016-01-26 DIAGNOSIS — Z79899 Other long term (current) drug therapy: Secondary | ICD-10-CM | POA: Diagnosis not present

## 2016-03-05 ENCOUNTER — Other Ambulatory Visit: Payer: Self-pay | Admitting: Physician Assistant

## 2016-03-05 DIAGNOSIS — F3341 Major depressive disorder, recurrent, in partial remission: Secondary | ICD-10-CM

## 2016-03-13 DIAGNOSIS — R944 Abnormal results of kidney function studies: Secondary | ICD-10-CM | POA: Diagnosis not present

## 2016-03-13 DIAGNOSIS — E039 Hypothyroidism, unspecified: Secondary | ICD-10-CM | POA: Diagnosis not present

## 2016-03-21 DIAGNOSIS — L570 Actinic keratosis: Secondary | ICD-10-CM | POA: Diagnosis not present

## 2016-03-21 DIAGNOSIS — Z85828 Personal history of other malignant neoplasm of skin: Secondary | ICD-10-CM | POA: Diagnosis not present

## 2016-04-04 ENCOUNTER — Encounter: Payer: Self-pay | Admitting: Physician Assistant

## 2016-06-28 DIAGNOSIS — Z719 Counseling, unspecified: Secondary | ICD-10-CM | POA: Diagnosis not present

## 2016-06-28 DIAGNOSIS — F418 Other specified anxiety disorders: Secondary | ICD-10-CM | POA: Diagnosis not present

## 2016-06-28 DIAGNOSIS — R944 Abnormal results of kidney function studies: Secondary | ICD-10-CM | POA: Diagnosis not present

## 2016-06-28 DIAGNOSIS — Z79899 Other long term (current) drug therapy: Secondary | ICD-10-CM | POA: Diagnosis not present

## 2016-06-28 DIAGNOSIS — Z008 Encounter for other general examination: Secondary | ICD-10-CM | POA: Diagnosis not present

## 2016-06-28 DIAGNOSIS — E039 Hypothyroidism, unspecified: Secondary | ICD-10-CM | POA: Diagnosis not present

## 2016-06-29 ENCOUNTER — Other Ambulatory Visit: Payer: Self-pay | Admitting: Physician Assistant

## 2016-06-29 DIAGNOSIS — F3341 Major depressive disorder, recurrent, in partial remission: Secondary | ICD-10-CM

## 2016-07-12 DIAGNOSIS — R944 Abnormal results of kidney function studies: Secondary | ICD-10-CM | POA: Diagnosis not present

## 2016-07-12 DIAGNOSIS — F418 Other specified anxiety disorders: Secondary | ICD-10-CM | POA: Diagnosis not present

## 2016-07-12 DIAGNOSIS — E782 Mixed hyperlipidemia: Secondary | ICD-10-CM | POA: Diagnosis not present

## 2016-07-12 DIAGNOSIS — E039 Hypothyroidism, unspecified: Secondary | ICD-10-CM | POA: Diagnosis not present

## 2016-07-24 ENCOUNTER — Ambulatory Visit: Payer: BLUE CROSS/BLUE SHIELD | Admitting: Physician Assistant

## 2016-08-14 ENCOUNTER — Encounter: Payer: Self-pay | Admitting: Family

## 2016-08-14 ENCOUNTER — Ambulatory Visit (INDEPENDENT_AMBULATORY_CARE_PROVIDER_SITE_OTHER): Payer: BLUE CROSS/BLUE SHIELD | Admitting: Family

## 2016-08-14 VITALS — BP 125/80 | HR 85 | Temp 98.5°F | Ht 62.5 in | Wt 156.6 lb

## 2016-08-14 DIAGNOSIS — M25562 Pain in left knee: Secondary | ICD-10-CM

## 2016-08-14 DIAGNOSIS — M1712 Unilateral primary osteoarthritis, left knee: Secondary | ICD-10-CM | POA: Diagnosis not present

## 2016-08-14 MED ORDER — METHYLPREDNISOLONE ACETATE 40 MG/ML IJ SUSP
40.0000 mg | Freq: Once | INTRAMUSCULAR | Status: AC
  Administered 2016-08-14: 40 mg via INTRA_ARTICULAR

## 2016-08-14 MED ORDER — BUPIVACAINE HCL 0.25 % IJ SOLN
1.0000 mL | Freq: Once | INTRAMUSCULAR | Status: AC
  Administered 2016-08-14: 1 mL via INTRA_ARTICULAR

## 2016-08-14 NOTE — Progress Notes (Signed)
   Subjective:    Patient ID: Reather Littler, female    DOB: 11-08-1955, 61 y.o.   MRN: [id number]  Knee Pain   The incident occurred more than 1 week ago. There was no injury mechanism. The pain is present in the left knee. The quality of the pain is described as aching. The pain is at a severity of 10/10. The pain is moderate. The pain has been constant since onset. Pertinent negatives include no loss of motion, numbness or tingling. She reports no foreign bodies present. The symptoms are aggravated by movement and weight bearing. She has tried acetaminophen, rest and NSAIDs for the symptoms. The treatment provided mild relief.      Review of Systems  Musculoskeletal: Positive for gait problem (left knee pain).  Neurological: Negative for tingling and numbness.  All other systems reviewed and are negative.      Objective:   Physical Exam  Constitutional: She is oriented to person, place, and time. She appears well-developed and well-nourished. No distress.  HENT:  Head: Normocephalic.  Eyes: Pupils are equal, round, and reactive to light.  Cardiovascular: Normal rate, regular rhythm, normal heart sounds and intact distal pulses.   No murmur heard. Pulmonary/Chest: Effort normal and breath sounds normal. No respiratory distress. She has no wheezes.  Abdominal: Soft. Bowel sounds are normal. She exhibits no distension. There is no tenderness.  Musculoskeletal: Normal range of motion. She exhibits tenderness (generalized left knee pain with extension and weight bearing ). She exhibits no edema.  Neurological: She is alert and oriented to person, place, and time.  Skin: Skin is warm and dry.  Psychiatric: She has a normal mood and affect. Her behavior is normal. Judgment and thought content normal.  Vitals reviewed.   leftknee prepped with betadine Injected with Marcaine .25% plain and methylprednisolone with 22 guage needle x 1. Patient tolerated well.   BP 125/80   Pulse 85    Temp 98.5 F (36.9 C) (Oral)   Ht 5' 2.5" (1.588 m)   Wt 156 lb 9.6 oz (71 kg)   BMI 28.19 kg/m      Assessment & Plan:  1. Left knee pain, unspecified chronicity - bupivacaine (MARCAINE) 0.25 % (with pres) injection 1 mL; Inject 1 mL into the articular space once. - methylPREDNISolone acetate (DEPO-MEDROL) injection 40 mg; Inject 1 mL (40 mg total) into the articular space once.  2. Primary osteoarthritis of left knee - bupivacaine (MARCAINE) 0.25 % (with pres) injection 1 mL; Inject 1 mL into the articular space once. - methylPREDNISolone acetate (DEPO-MEDROL) injection 40 mg; Inject 1 mL (40 mg total) into the articular space once.   Rest Ice  ROM exercises discussed  RTO prn   Evelina Dun, FNP

## 2016-08-14 NOTE — Patient Instructions (Signed)
Knee Pain, Adult Knee pain in adults is common. It can be caused by many things, including:  Arthritis.  A fluid-filled sac (cyst) or growth in your knee.  An infection in your knee.  An injury that will not heal.  Damage, swelling, or irritation of the tissues that support your knee. Knee pain is usually not a sign of a serious problem. The pain may go away on its own with time and rest. If it does not, a health care provider may order tests to find the cause of the pain. These may include:  Imaging tests, such as an X-ray, MRI, or ultrasound.  Joint aspiration. In this test, fluid is removed from the knee.  Arthroscopy. In this test, a lighted tube is inserted into knee and an image is projected onto a TV screen.  A biopsy. In this test, a sample of tissue is removed from the body and studied under a microscope. Follow these instructions at home: Pay attention to any changes in your symptoms. Take these actions to relieve your pain. Activity   Rest your knee.  Do not do things that cause pain or make pain worse.  Avoid high-impact activities or exercises, such as running, jumping rope, or doing jumping jacks. General instructions   Take over-the-counter and prescription medicines only as told by your health care provider.  Raise (elevate) your knee above the level of your heart when you are sitting or lying down.  Sleep with a pillow under your knee.  If directed, apply ice to the knee:  Put ice in a plastic bag.  Place a towel between your skin and the bag.  Leave the ice on for 20 minutes, 2-3 times a day.  Ask your health care provider if you should wear an elastic knee support.  Lose weight if you are overweight. Extra weight can put pressure on your knee.  Do not use any products that contain nicotine or tobacco, such as cigarettes and e-cigarettes. Smoking may slow the healing of any bone and joint problems that you may have. If you need help quitting, ask  your health care provider. Contact a health care provider if:  Your knee pain continues, changes, or gets worse.  You have a fever along with knee pain.  Your knee buckles or locks up.  Your knee swells, and the swelling becomes worse. Get help right away if:  Your knee feels warm to the touch.  You cannot move your knee.  You have severe pain in your knee.  You have chest pain.  You have trouble breathing. Summary  Knee pain in adults is common. It can be caused by many things, including, arthritis, infection, cysts, or injury.  Knee pain is usually not a sign of a serious problem, but if it does not go away, a health care provider may perform tests to know the cause of the pain.  Pay attention to any changes in your symptoms. Relieve your pain with rest, medicines, light activity, and use of ice.  Get help if your pain continues or becomes very severe, or if your knee buckles or locks up, or if you have chest pain or trouble breathing. This information is not intended to replace advice given to you by your health care provider. Make sure you discuss any questions you have with your health care provider. Document Released: 01/15/2007 Document Revised: 03/10/2016 Document Reviewed: 03/10/2016 Elsevier Interactive Patient Education  2017 [employer].

## 2016-09-12 DIAGNOSIS — Z1231 Encounter for screening mammogram for malignant neoplasm of breast: Secondary | ICD-10-CM | POA: Diagnosis not present

## 2016-09-20 DIAGNOSIS — E039 Hypothyroidism, unspecified: Secondary | ICD-10-CM | POA: Diagnosis not present

## 2016-09-20 DIAGNOSIS — R944 Abnormal results of kidney function studies: Secondary | ICD-10-CM | POA: Diagnosis not present

## 2016-09-25 DIAGNOSIS — R944 Abnormal results of kidney function studies: Secondary | ICD-10-CM | POA: Diagnosis not present

## 2016-09-25 DIAGNOSIS — E039 Hypothyroidism, unspecified: Secondary | ICD-10-CM | POA: Diagnosis not present

## 2016-12-27 DIAGNOSIS — E782 Mixed hyperlipidemia: Secondary | ICD-10-CM | POA: Diagnosis not present

## 2016-12-27 DIAGNOSIS — Z139 Encounter for screening, unspecified: Secondary | ICD-10-CM | POA: Diagnosis not present

## 2016-12-27 DIAGNOSIS — E039 Hypothyroidism, unspecified: Secondary | ICD-10-CM | POA: Diagnosis not present

## 2016-12-27 DIAGNOSIS — Z79899 Other long term (current) drug therapy: Secondary | ICD-10-CM | POA: Diagnosis not present

## 2017-01-01 DIAGNOSIS — E039 Hypothyroidism, unspecified: Secondary | ICD-10-CM | POA: Diagnosis not present

## 2017-01-01 DIAGNOSIS — R944 Abnormal results of kidney function studies: Secondary | ICD-10-CM | POA: Diagnosis not present

## 2017-01-01 DIAGNOSIS — F418 Other specified anxiety disorders: Secondary | ICD-10-CM | POA: Diagnosis not present

## 2017-01-01 DIAGNOSIS — Z008 Encounter for other general examination: Secondary | ICD-10-CM | POA: Diagnosis not present

## 2017-01-01 DIAGNOSIS — Z719 Counseling, unspecified: Secondary | ICD-10-CM | POA: Diagnosis not present

## 2017-04-23 DIAGNOSIS — Z719 Counseling, unspecified: Secondary | ICD-10-CM | POA: Diagnosis not present

## 2017-04-23 DIAGNOSIS — F418 Other specified anxiety disorders: Secondary | ICD-10-CM | POA: Diagnosis not present

## 2017-04-23 DIAGNOSIS — Z008 Encounter for other general examination: Secondary | ICD-10-CM | POA: Diagnosis not present

## 2017-04-23 DIAGNOSIS — R944 Abnormal results of kidney function studies: Secondary | ICD-10-CM | POA: Diagnosis not present

## 2017-04-23 DIAGNOSIS — E782 Mixed hyperlipidemia: Secondary | ICD-10-CM | POA: Diagnosis not present

## 2017-04-23 DIAGNOSIS — E039 Hypothyroidism, unspecified: Secondary | ICD-10-CM | POA: Diagnosis not present

## 2017-04-23 DIAGNOSIS — Z7722 Contact with and (suspected) exposure to environmental tobacco smoke (acute) (chronic): Secondary | ICD-10-CM | POA: Diagnosis not present

## 2017-05-16 DIAGNOSIS — Z716 Tobacco abuse counseling: Secondary | ICD-10-CM | POA: Diagnosis not present

## 2017-05-16 DIAGNOSIS — Z7722 Contact with and (suspected) exposure to environmental tobacco smoke (acute) (chronic): Secondary | ICD-10-CM | POA: Diagnosis not present

## 2017-05-16 DIAGNOSIS — F418 Other specified anxiety disorders: Secondary | ICD-10-CM | POA: Diagnosis not present

## 2017-06-04 DIAGNOSIS — Z7722 Contact with and (suspected) exposure to environmental tobacco smoke (acute) (chronic): Secondary | ICD-10-CM | POA: Diagnosis not present

## 2017-06-04 DIAGNOSIS — F418 Other specified anxiety disorders: Secondary | ICD-10-CM | POA: Diagnosis not present

## 2017-06-04 DIAGNOSIS — Z716 Tobacco abuse counseling: Secondary | ICD-10-CM | POA: Diagnosis not present

## 2017-06-21 ENCOUNTER — Encounter: Payer: Self-pay | Admitting: Nurse Practitioner

## 2017-06-21 ENCOUNTER — Ambulatory Visit: Payer: BLUE CROSS/BLUE SHIELD | Admitting: Nurse Practitioner

## 2017-06-21 VITALS — BP 120/73 | HR 84 | Temp 98.5°F | Ht 62.0 in | Wt 155.0 lb

## 2017-06-21 DIAGNOSIS — J40 Bronchitis, not specified as acute or chronic: Secondary | ICD-10-CM

## 2017-06-21 MED ORDER — AZITHROMYCIN 250 MG PO TABS: ORAL_TABLET | ORAL | 0 refills | Status: DC

## 2017-06-21 MED ORDER — BENZONATATE 100 MG PO CAPS: 100.0000 mg | ORAL_CAPSULE | Freq: Three times a day (TID) | ORAL | 0 refills | Status: DC | PRN

## 2017-06-21 MED ORDER — PREDNISONE 20 MG PO TABS: ORAL_TABLET | ORAL | 0 refills | Status: DC

## 2017-06-21 NOTE — Patient Instructions (Signed)

## 2017-06-21 NOTE — Progress Notes (Signed)
Subjective:    Patient ID: Candace Torres, female    DOB: 1956-03-04, 62 y.o.   MRN: [id number]  HPI Patient comes in today for c/o cough. Started about 7 day sago- is no better.    Review of Systems  Constitutional: Positive for chills and fatigue. Negative for fever.  HENT: Positive for congestion and rhinorrhea. Negative for ear pain, sore throat and trouble swallowing.   Respiratory: Positive for cough. Negative for shortness of breath.   Neurological: Positive for headaches.  Psychiatric/Behavioral: Negative.   All other systems reviewed and are negative.      Objective:   Physical Exam  Constitutional: She is oriented to person, place, and time. She appears well-developed and well-nourished. She appears distressed (mild).  HENT:  Right Ear: Hearing, tympanic membrane, external ear and ear canal normal.  Left Ear: Hearing, tympanic membrane, external ear and ear canal normal.  Nose: Mucosal edema and rhinorrhea present. Right sinus exhibits no maxillary sinus tenderness and no frontal sinus tenderness. Left sinus exhibits no maxillary sinus tenderness and no frontal sinus tenderness.  Mouth/Throat: Uvula is midline, oropharynx is clear and moist and mucous membranes are normal.  Eyes: Pupils are equal, round, and reactive to light.  Neck: Normal range of motion. Neck supple.  Cardiovascular: Normal rate and regular rhythm.  Pulmonary/Chest: Effort normal and breath sounds normal. No respiratory distress. She has no wheezes. She has no rales.  Deep wet cough   Abdominal: Soft. Bowel sounds are normal.  Neurological: She is alert and oriented to person, place, and time.  Skin: Skin is warm.  Psychiatric: She has a normal mood and affect. Her behavior is normal. Judgment and thought content normal.   BP 120/73   Pulse 84   Temp 98.5 F (36.9 C) (Oral)   Ht 5\' 2"  (1.575 m)   Wt 155 lb (70.3 kg)   BMI 28.35 kg/m      Assessment & Plan:   1. Bronchitis    Meds  ordered this encounter  Medications  . azithromycin (ZITHROMAX Z-PAK) 250 MG tablet    Sig: As directed    Dispense:  6 tablet    Refill:  0    Order Specific Question:   Supervising Provider    Answer:   VINCENT, CAROL L [[id number]]  . benzonatate (TESSALON PERLES) 100 MG capsule    Sig: Take 1 capsule (100 mg total) by mouth 3 (three) times daily as needed for cough.    Dispense:  20 capsule    Refill:  0    Order Specific Question:   Supervising Provider    Answer:   VINCENT, CAROL L [[id number]]  . predniSONE (DELTASONE) 20 MG tablet    Sig: 2 po at sametime daily for 5 days    Dispense:  10 tablet    Refill:  0    Order Specific Question:   Supervising Provider    Answer:   VINCENT, CAROL L [[id number]]   1. Take meds as prescribed 2. Use a cool mist humidifier especially during the winter months and when heat has been humid. 3. Use saline nose sprays frequently 4. Saline irrigations of the nose can be very helpful if done frequently.  * 4X daily for 1 week*  * Use of a nettie pot can be helpful with this. Follow directions with this* 5. Drink plenty of fluids 6. Keep thermostat turn down low 7.For any cough or congestion  Use plain Mucinex- regular strength or max strength  is fine   * Children- consult with Pharmacist for dosing 8. For fever or aces or pains- take tylenol or ibuprofen appropriate for age and weight.  * for fevers greater than 101 orally you may alternate ibuprofen and tylenol every  3 hours.   Mary-Margaret Hassell Done, FNP

## 2017-06-27 ENCOUNTER — Other Ambulatory Visit: Payer: BLUE CROSS/BLUE SHIELD | Admitting: Nurse Practitioner

## 2017-06-27 DIAGNOSIS — E039 Hypothyroidism, unspecified: Secondary | ICD-10-CM | POA: Diagnosis not present

## 2017-06-27 DIAGNOSIS — E782 Mixed hyperlipidemia: Secondary | ICD-10-CM | POA: Diagnosis not present

## 2017-06-27 DIAGNOSIS — Z79899 Other long term (current) drug therapy: Secondary | ICD-10-CM | POA: Diagnosis not present

## 2017-06-27 DIAGNOSIS — Z716 Tobacco abuse counseling: Secondary | ICD-10-CM | POA: Diagnosis not present

## 2017-06-27 DIAGNOSIS — Z7722 Contact with and (suspected) exposure to environmental tobacco smoke (acute) (chronic): Secondary | ICD-10-CM | POA: Diagnosis not present

## 2017-06-27 DIAGNOSIS — Z139 Encounter for screening, unspecified: Secondary | ICD-10-CM | POA: Diagnosis not present

## 2017-06-29 ENCOUNTER — Other Ambulatory Visit: Payer: BLUE CROSS/BLUE SHIELD | Admitting: Nurse Practitioner

## 2017-07-11 DIAGNOSIS — R7989 Other specified abnormal findings of blood chemistry: Secondary | ICD-10-CM | POA: Diagnosis not present

## 2017-07-11 DIAGNOSIS — E039 Hypothyroidism, unspecified: Secondary | ICD-10-CM | POA: Diagnosis not present

## 2017-07-11 DIAGNOSIS — E782 Mixed hyperlipidemia: Secondary | ICD-10-CM | POA: Diagnosis not present

## 2017-09-19 DIAGNOSIS — E782 Mixed hyperlipidemia: Secondary | ICD-10-CM | POA: Diagnosis not present

## 2017-09-19 DIAGNOSIS — E039 Hypothyroidism, unspecified: Secondary | ICD-10-CM | POA: Diagnosis not present

## 2017-09-19 DIAGNOSIS — Z79899 Other long term (current) drug therapy: Secondary | ICD-10-CM | POA: Diagnosis not present

## 2017-10-17 DIAGNOSIS — E782 Mixed hyperlipidemia: Secondary | ICD-10-CM | POA: Diagnosis not present

## 2017-10-17 DIAGNOSIS — R7989 Other specified abnormal findings of blood chemistry: Secondary | ICD-10-CM | POA: Diagnosis not present

## 2017-10-17 DIAGNOSIS — L84 Corns and callosities: Secondary | ICD-10-CM | POA: Diagnosis not present

## 2017-10-17 DIAGNOSIS — E039 Hypothyroidism, unspecified: Secondary | ICD-10-CM | POA: Diagnosis not present

## 2017-11-07 ENCOUNTER — Encounter: Payer: Self-pay | Admitting: Family Medicine

## 2017-11-07 ENCOUNTER — Ambulatory Visit: Payer: BLUE CROSS/BLUE SHIELD | Admitting: Family Medicine

## 2017-11-07 VITALS — BP 120/61 | HR 95 | Temp 101.5°F | Ht 62.0 in | Wt 151.0 lb

## 2017-11-07 DIAGNOSIS — R059 Cough, unspecified: Secondary | ICD-10-CM

## 2017-11-07 DIAGNOSIS — R509 Fever, unspecified: Secondary | ICD-10-CM | POA: Diagnosis not present

## 2017-11-07 DIAGNOSIS — R05 Cough: Secondary | ICD-10-CM

## 2017-11-07 MED ORDER — CLARITHROMYCIN 500 MG PO TABS: 500.0000 mg | ORAL_TABLET | Freq: Two times a day (BID) | ORAL | 0 refills | Status: AC

## 2017-11-07 MED ORDER — BENZONATATE 200 MG PO CAPS: 200.0000 mg | ORAL_CAPSULE | Freq: Three times a day (TID) | ORAL | 0 refills | Status: DC | PRN

## 2017-11-07 NOTE — Progress Notes (Signed)
Chief Complaint  Patient presents with  . Cough    HPI  Patient presents today for Patient presents with severe cough and shortness of breath.  She is also had fever with some chills.  Onset was about a week ago.  The tightness in her chest is fairly constant.  It is exacerbated when she starts coughing she coughs so hard she cannot stop.  She on the other hand will try to take over-the-counter cough syrup she is tried several and none of them of been very helpful for her.  Her cough is nonproductive it is not associated with any upper respiratory symptoms including sputum rhinorrhea and sore throat.  She has had no earaches. PMH: Smoking status smoked briefly as a teenager only  ROS: Per HPI  Objective: BP 120/61   Pulse 95   Temp (!) 101.5 F (38.6 C) (Oral)   Ht 5\' 2"  (1.575 m)   Wt 151 lb (68.5 kg)   BMI 27.62 kg/m  Gen: NAD, alert, cooperative with exam HEENT: NCAT, Nasal passages swollen, red TMS RED CV: RRR, good S1/S2, no murmur Resp: Bronchitis changes with scattered wheezes, non-labored Ext: No edema, warm Neuro: Alert and oriented, No gross deficits  Assessment and plan:  1. Fever and chills   2. Cough     No orders of the defined types were placed in this encounter.   No orders of the defined types were placed in this encounter.   Follow up as needed.  Claretta Fraise, MD

## 2017-11-27 ENCOUNTER — Ambulatory Visit (INDEPENDENT_AMBULATORY_CARE_PROVIDER_SITE_OTHER): Payer: BLUE CROSS/BLUE SHIELD | Admitting: Physician Assistant

## 2017-11-27 ENCOUNTER — Encounter: Payer: Self-pay | Admitting: Physician Assistant

## 2017-11-27 VITALS — BP 123/72 | HR 90 | Temp 98.4°F | Ht 62.0 in | Wt 151.0 lb

## 2017-11-27 DIAGNOSIS — R799 Abnormal finding of blood chemistry, unspecified: Secondary | ICD-10-CM | POA: Diagnosis not present

## 2017-11-27 DIAGNOSIS — R7989 Other specified abnormal findings of blood chemistry: Secondary | ICD-10-CM

## 2017-11-27 DIAGNOSIS — N393 Stress incontinence (female) (male): Secondary | ICD-10-CM | POA: Diagnosis not present

## 2017-11-27 DIAGNOSIS — Z01411 Encounter for gynecological examination (general) (routine) with abnormal findings: Secondary | ICD-10-CM | POA: Diagnosis not present

## 2017-11-27 DIAGNOSIS — N812 Incomplete uterovaginal prolapse: Secondary | ICD-10-CM

## 2017-11-27 DIAGNOSIS — R300 Dysuria: Secondary | ICD-10-CM | POA: Diagnosis not present

## 2017-11-27 DIAGNOSIS — Z01419 Encounter for gynecological examination (general) (routine) without abnormal findings: Secondary | ICD-10-CM

## 2017-11-27 DIAGNOSIS — N952 Postmenopausal atrophic vaginitis: Secondary | ICD-10-CM

## 2017-11-27 LAB — URINALYSIS, COMPLETE
Bilirubin, UA: NEGATIVE
Glucose, UA: NEGATIVE
Ketones, UA: NEGATIVE
Nitrite, UA: NEGATIVE
PROTEIN UA: NEGATIVE
Specific Gravity, UA: 1.02 (ref 1.005–1.030)
UUROB: 0.2 mg/dL (ref 0.2–1.0)
pH, UA: 6 (ref 5.0–7.5)

## 2017-11-27 LAB — CMP14+EGFR
ALBUMIN: 4.4 g/dL (ref 3.6–4.8)
ALT: 21 IU/L (ref 0–32)
AST: 23 IU/L (ref 0–40)
Albumin/Globulin Ratio: 1.4 (ref 1.2–2.2)
Alkaline Phosphatase: 85 IU/L (ref 39–117)
BILIRUBIN TOTAL: 0.5 mg/dL (ref 0.0–1.2)
BUN / CREAT RATIO: 11 — AB (ref 12–28)
BUN: 14 mg/dL (ref 8–27)
CO2: 22 mmol/L (ref 20–29)
CREATININE: 1.32 mg/dL — AB (ref 0.57–1.00)
Calcium: 9.8 mg/dL (ref 8.7–10.3)
Chloride: 103 mmol/L (ref 96–106)
GFR, EST AFRICAN AMERICAN: 49 mL/min/{1.73_m2} — AB (ref >59)
GFR, EST NON AFRICAN AMERICAN: 43 mL/min/{1.73_m2} — AB (ref >59)
GLUCOSE: 92 mg/dL (ref 65–99)
Globulin, Total: 3.1 g/dL (ref 1.5–4.5)
Potassium: 4.8 mmol/L (ref 3.5–5.2)
Sodium: 140 mmol/L (ref 134–144)
TOTAL PROTEIN: 7.5 g/dL (ref 6.0–8.5)

## 2017-11-27 LAB — MICROSCOPIC EXAMINATION: RENAL EPITHEL UA: NONE SEEN /HPF

## 2017-11-27 MED ORDER — ESTROGENS, CONJUGATED 0.625 MG/GM VA CREA: 1.0000 | TOPICAL_CREAM | Freq: Every day | VAGINAL | 12 refills | Status: DC

## 2017-11-27 MED ORDER — SULFAMETHOXAZOLE-TRIMETHOPRIM 800-160 MG PO TABS: 1.0000 | ORAL_TABLET | Freq: Two times a day (BID) | ORAL | 0 refills | Status: DC

## 2017-11-28 NOTE — Progress Notes (Signed)
BP 123/72   Pulse 90   Temp 98.4 F (36.9 C) (Oral)   Ht 5' 2"  (1.575 m)   Wt 151 lb (68.5 kg)   BMI 27.62 kg/m    Subjective:    Patient ID: Candace Torres, female    DOB: 07/13/1955, 62 y.o.   MRN: [id number]  HPI: Candace Torres is a 62 y.o. female presenting on 11/27/2017 for Annual Exam and Urinary Tract Infection  This patient comes in for her annual female exam.  In general she is feeling well.  She does have a couple problems that are involving loss of urine whenever she coughs or sneezes or lifts things.  She states that she has to wear a pad most all the time.  She does have severe dryness of her vagina and intercourse is very painful.  She avoids having intercourse because of this pain.  While at her work when labs were drawn her creatinine was slightly elevated.  We need to recheck this.  She states she does have dysuria at times.  She denies any other discharge.  It has been many years since she used any estrogen replacement after she went through menopause.  Past Medical History:  Diagnosis Date  . Depression   . GERD (gastroesophageal reflux disease)    Relevant past medical, surgical, family and social history reviewed and updated as indicated. Interim medical history since our last visit reviewed. Allergies and medications reviewed and updated. DATA REVIEWED: CHART IN EPIC  Family History reviewed for pertinent findings.  Review of Systems  Genitourinary: Positive for dyspareunia, dysuria and vaginal pain.    Allergies as of 11/27/2017   No Known Allergies     Medication List        Accurate as of 11/27/17 11:59 PM. Always use your most recent med list.          buPROPion 300 MG 24 hr tablet Commonly known as:  WELLBUTRIN XL TAKE 1 TABLET BY MOUTH  DAILY   conjugated estrogens vaginal cream Commonly known as:  PREMARIN Place 1 Applicatorful vaginally daily.   levothyroxine 75 MCG tablet Commonly known as:  SYNTHROID, LEVOTHROID TAKE 1 TABLET BY  MOUTH IN THE MORNING ON AN EMPTY STOMACH   omeprazole 40 MG capsule Commonly known as:  PRILOSEC TAKE 1 CAPSULE DAILY   PARoxetine 25 MG 24 hr tablet Commonly known as:  PAXIL-CR Take 1 tablet (25 mg total) by mouth daily.   spironolactone 25 MG tablet Commonly known as:  ALDACTONE Take 1 tablet (25 mg total) by mouth daily.   sulfamethoxazole-trimethoprim 800-160 MG tablet Commonly known as:  BACTRIM DS,SEPTRA DS Take 1 tablet by mouth 2 (two) times daily.          Objective:    BP 123/72   Pulse 90   Temp 98.4 F (36.9 C) (Oral)   Ht 5' 2"  (1.575 m)   Wt 151 lb (68.5 kg)   BMI 27.62 kg/m   No Known Allergies  Wt Readings from Last 3 Encounters:  11/27/17 151 lb (68.5 kg)  11/07/17 151 lb (68.5 kg)  06/21/17 155 lb (70.3 kg)    Physical Exam  Constitutional: She is oriented to person, place, and time. She appears well-developed and well-nourished.  HENT:  Head: Normocephalic and atraumatic.  Eyes: Pupils are equal, round, and reactive to light. Conjunctivae and EOM are normal.  Neck: Normal range of motion. Neck supple.  Cardiovascular: Normal rate, regular rhythm, normal heart sounds  and intact distal pulses.  Pulmonary/Chest: Effort normal and breath sounds normal. Right breast exhibits no mass, no skin change and no tenderness. Left breast exhibits no mass, no skin change and no tenderness. No breast tenderness, discharge or bleeding. Breasts are symmetrical.  Abdominal: Soft. Bowel sounds are normal.  Genitourinary: Uterus normal. Rectal exam shows no fissure. No breast tenderness, discharge or bleeding. There is no tenderness or lesion on the right labia. There is no tenderness or lesion on the left labia. Uterus is not deviated, not enlarged and not tender. Right adnexum displays no mass, no tenderness and no fullness. Left adnexum displays no mass, no tenderness and no fullness. No tenderness in the vagina.  Genitourinary Comments: Mild cystocele appreciated.   There is severe vaginal atrophy.  I was unable to use speculum to inspect due to her pain.  Manual exam showed no masses tenderness fullness.  Neurological: She is alert and oriented to person, place, and time. She has normal reflexes.  Skin: Skin is warm and dry. No rash noted.  Psychiatric: She has a normal mood and affect. Her behavior is normal. Judgment and thought content normal.    Results for orders placed or performed in visit on 11/27/17  Microscopic Examination  Result Value Ref Range   WBC, UA 6-10 (A) 0 - 5 /hpf   RBC, UA 11-30 (A) 0 - 2 /hpf   Epithelial Cells (non renal) 0-10 0 - 10 /hpf   Renal Epithel, UA None seen None seen /hpf   Bacteria, UA Moderate (A) None seen/Few  Urinalysis, Complete  Result Value Ref Range   Specific Gravity, UA 1.020 1.005 - 1.030   pH, UA 6.0 5.0 - 7.5   Color, UA Yellow Yellow   Appearance Ur Clear Clear   Leukocytes, UA Trace (A) Negative   Protein, UA Negative Negative/Trace   Glucose, UA Negative Negative   Ketones, UA Negative Negative   RBC, UA 2+ (A) Negative   Bilirubin, UA Negative Negative   Urobilinogen, Ur 0.2 0.2 - 1.0 mg/dL   Nitrite, UA Negative Negative   Microscopic Examination See below:   CMP14+EGFR  Result Value Ref Range   Glucose 92 65 - 99 mg/dL   BUN 14 8 - 27 mg/dL   Creatinine, Ser 1.32 (H) 0.57 - 1.00 mg/dL   GFR calc non Af Amer 43 (L) >59 mL/min/1.73   GFR calc Af Amer 49 (L) >59 mL/min/1.73   BUN/Creatinine Ratio 11 (L) 12 - 28   Sodium 140 134 - 144 mmol/L   Potassium 4.8 3.5 - 5.2 mmol/L   Chloride 103 96 - 106 mmol/L   CO2 22 20 - 29 mmol/L   Calcium 9.8 8.7 - 10.3 mg/dL   Total Protein 7.5 6.0 - 8.5 g/dL   Albumin 4.4 3.6 - 4.8 g/dL   Globulin, Total 3.1 1.5 - 4.5 g/dL   Albumin/Globulin Ratio 1.4 1.2 - 2.2   Bilirubin Total 0.5 0.0 - 1.2 mg/dL   Alkaline Phosphatase 85 39 - 117 IU/L   AST 23 0 - 40 IU/L   ALT 21 0 - 32 IU/L      Assessment & Plan:   1. Well female exam with routine  gynecological exam Continue health maintenance   2. Dysuria - Urine Culture - Urinalysis, Complete - sulfamethoxazole-trimethoprim (BACTRIM DS) 800-160 MG tablet; Take 1 tablet by mouth 2 (two) times daily.  Dispense: 14 tablet; Refill: 0  3. Abnormal blood creatinine level - Microalbumin / creatinine urine  ratio - CMP14+EGFR  4. Stress incontinence Consider urology referral  5. Atrophic vaginitis - conjugated estrogens (PREMARIN) vaginal cream; Place 1 Applicatorful vaginally daily.  Dispense: 42.5 g; Refill: 12  6. Cystocele with incomplete uterovaginal prolapse Consider urology referral   Continue all other maintenance medications as listed above.  Follow up plan: Return in about 6 months (around 05/30/2018).  Educational handout given for [place] PA-C [facility] [address]  [place], [place] [address] [phone]   11/28/2017, 12:14 PM

## 2017-11-29 LAB — URINE CULTURE

## 2018-01-23 DIAGNOSIS — E782 Mixed hyperlipidemia: Secondary | ICD-10-CM | POA: Diagnosis not present

## 2018-01-23 DIAGNOSIS — E039 Hypothyroidism, unspecified: Secondary | ICD-10-CM | POA: Diagnosis not present

## 2018-01-23 DIAGNOSIS — Z719 Counseling, unspecified: Secondary | ICD-10-CM | POA: Diagnosis not present

## 2018-01-23 DIAGNOSIS — Z008 Encounter for other general examination: Secondary | ICD-10-CM | POA: Diagnosis not present

## 2018-02-01 ENCOUNTER — Ambulatory Visit: Payer: BLUE CROSS/BLUE SHIELD | Admitting: Physician Assistant

## 2018-02-07 ENCOUNTER — Ambulatory Visit: Payer: BLUE CROSS/BLUE SHIELD | Admitting: Physician Assistant

## 2018-02-07 DIAGNOSIS — C44722 Squamous cell carcinoma of skin of right lower limb, including hip: Secondary | ICD-10-CM | POA: Diagnosis not present

## 2018-04-17 DIAGNOSIS — R7989 Other specified abnormal findings of blood chemistry: Secondary | ICD-10-CM | POA: Diagnosis not present

## 2018-04-17 DIAGNOSIS — E039 Hypothyroidism, unspecified: Secondary | ICD-10-CM | POA: Diagnosis not present

## 2018-04-17 DIAGNOSIS — Z1389 Encounter for screening for other disorder: Secondary | ICD-10-CM | POA: Diagnosis not present

## 2018-04-17 DIAGNOSIS — Z79899 Other long term (current) drug therapy: Secondary | ICD-10-CM | POA: Diagnosis not present

## 2018-04-17 DIAGNOSIS — Z719 Counseling, unspecified: Secondary | ICD-10-CM | POA: Diagnosis not present

## 2018-04-17 DIAGNOSIS — E782 Mixed hyperlipidemia: Secondary | ICD-10-CM | POA: Diagnosis not present

## 2018-04-17 DIAGNOSIS — Z008 Encounter for other general examination: Secondary | ICD-10-CM | POA: Diagnosis not present

## 2018-04-22 DIAGNOSIS — R7989 Other specified abnormal findings of blood chemistry: Secondary | ICD-10-CM | POA: Diagnosis not present

## 2018-04-22 DIAGNOSIS — R7301 Impaired fasting glucose: Secondary | ICD-10-CM | POA: Diagnosis not present

## 2018-04-22 DIAGNOSIS — E782 Mixed hyperlipidemia: Secondary | ICD-10-CM | POA: Diagnosis not present

## 2018-04-22 DIAGNOSIS — E039 Hypothyroidism, unspecified: Secondary | ICD-10-CM | POA: Diagnosis not present

## 2018-05-30 ENCOUNTER — Telehealth: Payer: Self-pay | Admitting: Physician Assistant

## 2018-05-30 NOTE — Telephone Encounter (Signed)
Pt came in to clinic requesting increase or change in depression medication due to increased stress at work. Pt denies any suicidal ideations Pt has been seeing NP at [facility] for depression and stress NP informed pt that she didn't see any adjustments that could be made Pt informed that before adjustments could be made in medications by Particia Nearing, pt would need appt to discuss Pt declined appt stating she will follow up with [facility] NP

## 2018-06-10 DIAGNOSIS — L821 Other seborrheic keratosis: Secondary | ICD-10-CM | POA: Diagnosis not present

## 2018-06-10 DIAGNOSIS — L280 Lichen simplex chronicus: Secondary | ICD-10-CM | POA: Diagnosis not present

## 2018-06-10 DIAGNOSIS — Z85828 Personal history of other malignant neoplasm of skin: Secondary | ICD-10-CM | POA: Diagnosis not present

## 2018-06-10 DIAGNOSIS — L570 Actinic keratosis: Secondary | ICD-10-CM | POA: Diagnosis not present

## 2018-06-26 DIAGNOSIS — F418 Other specified anxiety disorders: Secondary | ICD-10-CM | POA: Diagnosis not present

## 2018-10-30 DIAGNOSIS — Z1231 Encounter for screening mammogram for malignant neoplasm of breast: Secondary | ICD-10-CM | POA: Diagnosis not present

## 2018-11-13 DIAGNOSIS — R7989 Other specified abnormal findings of blood chemistry: Secondary | ICD-10-CM | POA: Diagnosis not present

## 2018-11-13 DIAGNOSIS — E782 Mixed hyperlipidemia: Secondary | ICD-10-CM | POA: Diagnosis not present

## 2018-11-13 DIAGNOSIS — E039 Hypothyroidism, unspecified: Secondary | ICD-10-CM | POA: Diagnosis not present

## 2018-11-13 DIAGNOSIS — Z008 Encounter for other general examination: Secondary | ICD-10-CM | POA: Diagnosis not present

## 2018-12-16 DIAGNOSIS — E039 Hypothyroidism, unspecified: Secondary | ICD-10-CM | POA: Diagnosis not present

## 2018-12-16 DIAGNOSIS — K219 Gastro-esophageal reflux disease without esophagitis: Secondary | ICD-10-CM | POA: Diagnosis not present

## 2018-12-16 DIAGNOSIS — Z79899 Other long term (current) drug therapy: Secondary | ICD-10-CM | POA: Diagnosis not present

## 2018-12-16 DIAGNOSIS — E782 Mixed hyperlipidemia: Secondary | ICD-10-CM | POA: Diagnosis not present

## 2018-12-16 DIAGNOSIS — Z139 Encounter for screening, unspecified: Secondary | ICD-10-CM | POA: Diagnosis not present

## 2018-12-25 DIAGNOSIS — R7301 Impaired fasting glucose: Secondary | ICD-10-CM | POA: Diagnosis not present

## 2018-12-25 DIAGNOSIS — E039 Hypothyroidism, unspecified: Secondary | ICD-10-CM | POA: Diagnosis not present

## 2018-12-25 DIAGNOSIS — E782 Mixed hyperlipidemia: Secondary | ICD-10-CM | POA: Diagnosis not present

## 2018-12-25 DIAGNOSIS — R7989 Other specified abnormal findings of blood chemistry: Secondary | ICD-10-CM | POA: Diagnosis not present

## 2019-01-16 ENCOUNTER — Other Ambulatory Visit: Payer: Self-pay | Admitting: Nurse Practitioner

## 2019-07-25 ENCOUNTER — Other Ambulatory Visit: Payer: Self-pay

## 2019-07-25 ENCOUNTER — Encounter: Payer: BLUE CROSS/BLUE SHIELD | Admitting: Physician Assistant

## 2019-07-25 ENCOUNTER — Ambulatory Visit (INDEPENDENT_AMBULATORY_CARE_PROVIDER_SITE_OTHER): Payer: BC Managed Care – PPO | Admitting: Family

## 2019-07-25 ENCOUNTER — Encounter: Payer: Self-pay | Admitting: Family

## 2019-07-25 VITALS — BP 130/71 | HR 77 | Temp 97.6°F | Ht 62.0 in | Wt 157.6 lb

## 2019-07-25 DIAGNOSIS — F329 Major depressive disorder, single episode, unspecified: Secondary | ICD-10-CM | POA: Diagnosis not present

## 2019-07-25 DIAGNOSIS — E039 Hypothyroidism, unspecified: Secondary | ICD-10-CM

## 2019-07-25 DIAGNOSIS — R300 Dysuria: Secondary | ICD-10-CM | POA: Diagnosis not present

## 2019-07-25 DIAGNOSIS — Z1159 Encounter for screening for other viral diseases: Secondary | ICD-10-CM

## 2019-07-25 DIAGNOSIS — Z0001 Encounter for general adult medical examination with abnormal findings: Secondary | ICD-10-CM

## 2019-07-25 DIAGNOSIS — N183 Chronic kidney disease, stage 3 unspecified: Secondary | ICD-10-CM

## 2019-07-25 DIAGNOSIS — Z1211 Encounter for screening for malignant neoplasm of colon: Secondary | ICD-10-CM

## 2019-07-25 DIAGNOSIS — N952 Postmenopausal atrophic vaginitis: Secondary | ICD-10-CM | POA: Diagnosis not present

## 2019-07-25 DIAGNOSIS — Z0000 Encounter for general adult medical examination without abnormal findings: Secondary | ICD-10-CM

## 2019-07-25 DIAGNOSIS — K219 Gastro-esophageal reflux disease without esophagitis: Secondary | ICD-10-CM

## 2019-07-25 DIAGNOSIS — F32A Depression, unspecified: Secondary | ICD-10-CM

## 2019-07-25 DIAGNOSIS — R609 Edema, unspecified: Secondary | ICD-10-CM

## 2019-07-25 DIAGNOSIS — L989 Disorder of the skin and subcutaneous tissue, unspecified: Secondary | ICD-10-CM

## 2019-07-25 DIAGNOSIS — F3341 Major depressive disorder, recurrent, in partial remission: Secondary | ICD-10-CM

## 2019-07-25 DIAGNOSIS — Z01419 Encounter for gynecological examination (general) (routine) without abnormal findings: Secondary | ICD-10-CM | POA: Diagnosis not present

## 2019-07-25 DIAGNOSIS — F411 Generalized anxiety disorder: Secondary | ICD-10-CM

## 2019-07-25 LAB — MICROSCOPIC EXAMINATION
Epithelial Cells (non renal): >10 /hpf — AB (ref 0–10)
Renal Epithel, UA: NONE SEEN /hpf

## 2019-07-25 LAB — URINALYSIS, COMPLETE
Bilirubin, UA: NEGATIVE
Glucose, UA: NEGATIVE
Ketones, UA: NEGATIVE
Nitrite, UA: NEGATIVE
Protein,UA: NEGATIVE
Specific Gravity, UA: 1.025 (ref 1.005–1.030)
Urobilinogen, Ur: 0.2 mg/dL (ref 0.2–1.0)
pH, UA: 5.5 (ref 5.0–7.5)

## 2019-07-25 MED ORDER — BUPROPION HCL ER (XL) 300 MG PO TB24: 300.0000 mg | ORAL_TABLET | Freq: Every day | ORAL | 0 refills | Status: DC

## 2019-07-25 MED ORDER — OMEPRAZOLE 40 MG PO CPDR: 40.0000 mg | DELAYED_RELEASE_CAPSULE | Freq: Every day | ORAL | 3 refills | Status: DC

## 2019-07-25 MED ORDER — PAROXETINE HCL ER 25 MG PO TB24: 25.0000 mg | ORAL_TABLET | Freq: Every day | ORAL | 3 refills | Status: DC

## 2019-07-25 MED ORDER — ESTROGENS, CONJUGATED 0.625 MG/GM VA CREA: 1.0000 | TOPICAL_CREAM | Freq: Every day | VAGINAL | 12 refills | Status: DC

## 2019-07-25 MED ORDER — SPIRONOLACTONE 25 MG PO TABS: 25.0000 mg | ORAL_TABLET | Freq: Every day | ORAL | 3 refills | Status: DC

## 2019-07-25 MED ORDER — LEVOTHYROXINE SODIUM 75 MCG PO TABS: ORAL_TABLET | ORAL | 2 refills | Status: DC

## 2019-07-25 NOTE — Progress Notes (Signed)
Subjective:    Patient ID: Candace Torres, female    DOB: [birth year], 64 y.o.   MRN: [id number]  Chief Complaint  Patient presents with  . Annual Exam    With pap  . Fatigue   Pt presents to the office today for CPE with pap. She is complaining of a nonhealing skin lesion on her posterior right thigh that she noticed two months ago.  Gastroesophageal Reflux She complains of belching and heartburn. This is a chronic problem. The current episode started more than 1 year ago. The problem occurs occasionally. The problem has been waxing and waning. She has tried a PPI for the symptoms. The treatment provided moderate relief.  Anxiety Presents for follow-up visit. Symptoms include depressed mood and excessive worry. Patient reports no impotence, irritability, panic or restlessness. Symptoms occur occasionally. The severity of symptoms is moderate.    Depression        This is a chronic problem.  The current episode started more than 1 year ago.   The problem occurs intermittently.  The problem has been waxing and waning since onset.  Associated symptoms include sad.  Associated symptoms include no helplessness, no hopelessness, not irritable and no restlessness.  Past medical history includes anxiety.   Back Pain This is a new problem. The current episode started 1 to 4 weeks ago. The problem occurs intermittently. The problem has been waxing and waning since onset. The pain is present in the lumbar spine. The quality of the pain is described as aching. The pain is at a severity of 1/10. The pain is mild. Pertinent negatives include no dysuria or leg pain.      Review of Systems  Constitutional: Negative for irritability.  Gastrointestinal: Positive for heartburn.  Genitourinary: Negative for dysuria and impotence.  Musculoskeletal: Positive for back pain.  Psychiatric/Behavioral: Positive for depression.  All other systems reviewed and are negative.  Family History  Problem Relation  Age of Onset  . Depression Mother   . Hypertension Mother   . COPD Father    Social History   Socioeconomic History  . Marital status: Married    Spouse name: Not on file  . Number of children: Not on file  . Years of education: Not on file  . Highest education level: Not on file  Occupational History  . Not on file  Tobacco Use  . Smoking status: Former Research scientist (life sciences)  . Smokeless tobacco: Never Used  Substance and Sexual Activity  . Alcohol use: No  . Drug use: No  . Sexual activity: Not on file  Other Topics Concern  . Not on file  Social History Narrative  . Not on file   Social Determinants of Health   Financial Resource Strain:   . Difficulty of Paying Living Expenses:   Food Insecurity:   . Worried About Charity fundraiser in the Last Year:   . Arboriculturist in the Last Year:   Transportation Needs:   . Film/video editor (Medical):   Marland Kitchen Lack of Transportation (Non-Medical):   Physical Activity:   . Days of Exercise per Week:   . Minutes of Exercise per Session:   Stress:   . Feeling of Stress :   Social Connections:   . Frequency of Communication with Friends and Family:   . Frequency of Social Gatherings with Friends and Family:   . Attends Religious Services:   . Active Member of Clubs or Organizations:   . Attends Club  or Organization Meetings:   Marland Kitchen Marital Status:        Objective:   Physical Exam Vitals reviewed.  Constitutional:      General: She is not irritable.She is not in acute distress.    Appearance: She is well-developed.  HENT:     Head: Normocephalic and atraumatic.     Right Ear: Tympanic membrane normal.     Left Ear: Tympanic membrane normal.  Eyes:     Pupils: Pupils are equal, round, and reactive to light.  Neck:     Thyroid: No thyromegaly.  Cardiovascular:     Rate and Rhythm: Normal rate and regular rhythm.     Heart sounds: Normal heart sounds. No murmur.  Pulmonary:     Effort: Pulmonary effort is normal. No  respiratory distress.     Breath sounds: Normal breath sounds. No wheezing.  Chest:     Breasts:        Right: No swelling, bleeding, inverted nipple, mass, nipple discharge, skin change or tenderness.        Left: No swelling, bleeding, inverted nipple, mass, nipple discharge, skin change or tenderness.  Abdominal:     General: Bowel sounds are normal. There is no distension.     Palpations: Abdomen is soft.     Tenderness: There is no abdominal tenderness.  Genitourinary:    General: Normal vulva.     Comments: Bimanual exam- no adnexal masses or tenderness, ovaries nonpalpable   Cervix parous and pink- No discharge   atrophic Vagitis present Musculoskeletal:        General: No tenderness. Normal range of motion.     Cervical back: Normal range of motion and neck supple.  Skin:    General: Skin is warm and dry.     Findings: Lesion present.          Comments: Skin lesion  Neurological:     Mental Status: She is alert and oriented to person, place, and time.     Cranial Nerves: No cranial nerve deficit.     Deep Tendon Reflexes: Reflexes are normal and symmetric.  Psychiatric:        Behavior: Behavior normal.        Thought Content: Thought content normal.        Judgment: Judgment normal.       BP 130/71   Pulse 77   Temp 97.6 F (36.4 C) (Temporal)   Ht 5' 2"  (1.575 m)   Wt 157 lb 9.6 oz (71.5 kg)   SpO2 99%   BMI 28.83 kg/m      Assessment & Plan:  Candace Torres comes in today with chief complaint of Annual Exam (With pap) and Fatigue   Diagnosis and orders addressed:  1. Dysuria - Urinalysis, Complete - Urine Culture - CMP14+EGFR - CBC with Differential/Platelet  2. Depression - PARoxetine (PAXIL-CR) 25 MG 24 hr tablet; Take 1 tablet (25 mg total) by mouth daily.  Dispense: 90 tablet; Refill: 3 - CMP14+EGFR - CBC with Differential/Platelet  3. Atrophic vaginitis - conjugated estrogens (PREMARIN) vaginal cream; Place 1 Applicatorful vaginally  daily.  Dispense: 42.5 g; Refill: 12 - CMP14+EGFR - CBC with Differential/Platelet  4. Edema, unspecified type - spironolactone (ALDACTONE) 25 MG tablet; Take 1 tablet (25 mg total) by mouth daily.  Dispense: 90 tablet; Refill: 3 - CMP14+EGFR - CBC with Differential/Platelet  5. Recurrent major depressive disorder, in partial remission (HCC) - buPROPion (WELLBUTRIN XL) 300 MG 24 hr tablet; Take 1  tablet (300 mg total) by mouth daily.  Dispense: 90 tablet; Refill: 0 - CMP14+EGFR - CBC with Differential/Platelet  6. Annual physical exam - CMP14+EGFR - CBC with Differential/Platelet - Lipid panel - TSH - Hepatitis C antibody - HIV Antibody (routine testing w rflx) - IGP, Aptima HPV, rfx 16/18,45  7. Gastroesophageal reflux disease without esophagitis - CMP14+EGFR - CBC with Differential/Platelet  8. Stage 3 chronic kidney disease, unspecified whether stage 3a or 3b CKD - CMP14+EGFR - CBC with Differential/Platelet  9. GAD (generalized anxiety disorder) - CMP14+EGFR - CBC with Differential/Platelet  10. Hypothyroidism, unspecified type - CMP14+EGFR - CBC with Differential/Platelet - TSH  11. Encounter for gynecological examination without abnormal finding - CMP14+EGFR - CBC with Differential/Platelet - IGP, Aptima HPV, rfx 16/18,45  12. Need for hepatitis C screening test - CMP14+EGFR - CBC with Differential/Platelet - Hepatitis C antibody  13. Colon cancer screening - CMP14+EGFR - CBC with Differential/Platelet - Ambulatory referral to Gastroenterology  14. Skin lesion - Ambulatory referral to Dermatology   Labs pending Health Maintenance reviewed Diet and exercise encouraged  Follow up plan: 6 months   Evelina Dun, FNP

## 2019-07-25 NOTE — Patient Instructions (Signed)
Health Maintenance After Age 65 After age 66, you are at a higher risk for certain long-term diseases and infections as well as injuries from falls. Falls are a major cause of broken bones and head injuries in people who are older than age 66. Getting regular preventive care can help to keep you healthy and well. Preventive care includes getting regular testing and making lifestyle changes as recommended by your health care provider. Talk with your health care provider about:  Which screenings and tests you should have. A screening is a test that checks for a disease when you have no symptoms.  A diet and exercise plan that is right for you. What should I know about screenings and tests to prevent falls? Screening and testing are the best ways to find a health problem early. Early diagnosis and treatment give you the best chance of managing medical conditions that are common after age 66. Certain conditions and lifestyle choices may make you more likely to have a fall. Your health care provider may recommend:  Regular vision checks. Poor vision and conditions such as cataracts can make you more likely to have a fall. If you wear glasses, make sure to get your prescription updated if your vision changes.  Medicine review. Work with your health care provider to regularly review all of the medicines you are taking, including over-the-counter medicines. Ask your health care provider about any side effects that may make you more likely to have a fall. Tell your health care provider if any medicines that you take make you feel dizzy or sleepy.  Osteoporosis screening. Osteoporosis is a condition that causes the bones to get weaker. This can make the bones weak and cause them to break more easily.  Blood pressure screening. Blood pressure changes and medicines to control blood pressure can make you feel dizzy.  Strength and balance checks. Your health care provider may recommend certain tests to check your  strength and balance while standing, walking, or changing positions.  Foot health exam. Foot pain and numbness, as well as not wearing proper footwear, can make you more likely to have a fall.  Depression screening. You may be more likely to have a fall if you have a fear of falling, feel emotionally low, or feel unable to do activities that you used to do.  Alcohol use screening. Using too much alcohol can affect your balance and may make you more likely to have a fall. What actions can I take to lower my risk of falls? General instructions  Talk with your health care provider about your risks for falling. Tell your health care provider if: ? You fall. Be sure to tell your health care provider about all falls, even ones that seem minor. ? You feel dizzy, sleepy, or off-balance.  Take over-the-counter and prescription medicines only as told by your health care provider. These include any supplements.  Eat a healthy diet and maintain a healthy weight. A healthy diet includes low-fat dairy products, low-fat (lean) meats, and fiber from whole grains, beans, and lots of fruits and vegetables. Home safety  Remove any tripping hazards, such as rugs, cords, and clutter.  Install safety equipment such as grab bars in bathrooms and safety rails on stairs.  Keep rooms and walkways well-lit. Activity   Follow a regular exercise program to stay fit. This will help you maintain your balance. Ask your health care provider what types of exercise are appropriate for you.  If you need a cane or   walker, use it as recommended by your health care provider.  Wear supportive shoes that have nonskid soles. Lifestyle  Do not drink alcohol if your health care provider tells you not to drink.  If you drink alcohol, limit how much you have: ? 0-1 drink a day for women. ? 0-2 drinks a day for men.  Be aware of how much alcohol is in your drink. In the [place] one drink equals one typical bottle of beer (12  oz), one-half glass of wine (5 oz), or one shot of hard liquor (1 oz).  Do not use any products that contain nicotine or tobacco, such as cigarettes and e-cigarettes. If you need help quitting, ask your health care provider. Summary  Having a healthy lifestyle and getting preventive care can help to protect your health and wellness after age 66.  Screening and testing are the best way to find a health problem early and help you avoid having a fall. Early diagnosis and treatment give you the best chance for managing medical conditions that are more common for people who are older than age 66.  Falls are a major cause of broken bones and head injuries in people who are older than age 66. Take precautions to prevent a fall at home.  Work with your health care provider to learn what changes you can make to improve your health and wellness and to prevent falls. This information is not intended to replace advice given to you by your health care provider. Make sure you discuss any questions you have with your health care provider. Document Revised: 07/11/2018 Document Reviewed: 01/31/2017 Elsevier Patient Education  2020 Elsevier Inc[employer]  

## 2019-07-26 LAB — CBC WITH DIFFERENTIAL/PLATELET
Basophils Absolute: 0.2 10*3/uL (ref 0.0–0.2)
Basos: 2 %
EOS (ABSOLUTE): 0.4 10*3/uL (ref 0.0–0.4)
Eos: 4 %
Hematocrit: 41.5 % (ref 34.0–46.6)
Hemoglobin: 14.4 g/dL (ref 11.1–15.9)
Immature Grans (Abs): 0.1 10*3/uL (ref 0.0–0.1)
Immature Granulocytes: 1 %
Lymphocytes Absolute: 3.2 10*3/uL — ABNORMAL HIGH (ref 0.7–3.1)
Lymphs: 31 %
MCH: 31 pg (ref 26.6–33.0)
MCHC: 34.7 g/dL (ref 31.5–35.7)
MCV: 89 fL (ref 79–97)
Monocytes Absolute: 0.7 10*3/uL (ref 0.1–0.9)
Monocytes: 7 %
Neutrophils Absolute: 5.7 10*3/uL (ref 1.4–7.0)
Neutrophils: 55 %
Platelets: 257 10*3/uL (ref 150–450)
RBC: 4.65 x10E6/uL (ref 3.77–5.28)
RDW: 12.5 % (ref 11.7–15.4)
WBC: 10.3 10*3/uL (ref 3.4–10.8)

## 2019-07-26 LAB — CMP14+EGFR
ALT: 36 IU/L — ABNORMAL HIGH (ref 0–32)
AST: 32 IU/L (ref 0–40)
Albumin/Globulin Ratio: 1.8 (ref 1.2–2.2)
Albumin: 4.6 g/dL (ref 3.8–4.8)
Alkaline Phosphatase: 91 IU/L (ref 39–117)
BUN/Creatinine Ratio: 13 (ref 12–28)
BUN: 18 mg/dL (ref 8–27)
Bilirubin Total: 0.6 mg/dL (ref 0.0–1.2)
CO2: 22 mmol/L (ref 20–29)
Calcium: 10 mg/dL (ref 8.7–10.3)
Chloride: 102 mmol/L (ref 96–106)
Creatinine, Ser: 1.36 mg/dL — ABNORMAL HIGH (ref 0.57–1.00)
GFR calc Af Amer: 47 mL/min/{1.73_m2} — ABNORMAL LOW (ref >59)
GFR calc non Af Amer: 41 mL/min/{1.73_m2} — ABNORMAL LOW (ref >59)
Globulin, Total: 2.5 g/dL (ref 1.5–4.5)
Glucose: 90 mg/dL (ref 65–99)
Potassium: 4.4 mmol/L (ref 3.5–5.2)
Sodium: 138 mmol/L (ref 134–144)
Total Protein: 7.1 g/dL (ref 6.0–8.5)

## 2019-07-26 LAB — TSH: TSH: 2.04 u[IU]/mL (ref 0.450–4.500)

## 2019-07-26 LAB — LIPID PANEL
Chol/HDL Ratio: 4.5 ratio — ABNORMAL HIGH (ref 0.0–4.4)
Cholesterol, Total: 168 mg/dL (ref 100–199)
HDL: 37 mg/dL — ABNORMAL LOW (ref >39)
LDL Chol Calc (NIH): 103 mg/dL — ABNORMAL HIGH (ref 0–99)
Triglycerides: 156 mg/dL — ABNORMAL HIGH (ref 0–149)
VLDL Cholesterol Cal: 28 mg/dL (ref 5–40)

## 2019-07-26 LAB — HEPATITIS C ANTIBODY: Hep C Virus Ab: <0.1 s/co ratio (ref 0.0–0.9)

## 2019-07-26 LAB — HIV ANTIBODY (ROUTINE TESTING W REFLEX): HIV Screen 4th Generation wRfx: NONREACTIVE

## 2019-07-28 ENCOUNTER — Other Ambulatory Visit: Payer: Self-pay | Admitting: Family

## 2019-07-28 LAB — URINE CULTURE

## 2019-07-28 MED ORDER — NITROFURANTOIN MONOHYD MACRO 100 MG PO CAPS
100.0000 mg | ORAL_CAPSULE | Freq: Two times a day (BID) | ORAL | 0 refills | Status: DC
Start: 2019-07-28 — End: 2019-10-28

## 2019-07-29 ENCOUNTER — Encounter: Payer: Self-pay | Admitting: Internal Medicine

## 2019-07-29 LAB — IGP, APTIMA HPV, RFX 16/18,45: HPV Aptima: NEGATIVE

## 2019-08-29 ENCOUNTER — Ambulatory Visit: Payer: Self-pay | Admitting: Gastroenterology

## 2019-10-23 DIAGNOSIS — H00025 Hordeolum internum left lower eyelid: Secondary | ICD-10-CM | POA: Diagnosis not present

## 2019-10-23 DIAGNOSIS — H40033 Anatomical narrow angle, bilateral: Secondary | ICD-10-CM | POA: Diagnosis not present

## 2019-10-28 ENCOUNTER — Encounter: Payer: Self-pay | Admitting: Gastroenterology

## 2019-10-28 ENCOUNTER — Other Ambulatory Visit: Payer: Self-pay

## 2019-10-28 ENCOUNTER — Encounter: Payer: Self-pay | Admitting: *Deleted

## 2019-10-28 ENCOUNTER — Ambulatory Visit (INDEPENDENT_AMBULATORY_CARE_PROVIDER_SITE_OTHER): Payer: BC Managed Care – PPO | Admitting: Gastroenterology

## 2019-10-28 VITALS — BP 130/67 | HR 80 | Temp 98.8°F | Ht 62.0 in | Wt 154.0 lb

## 2019-10-28 DIAGNOSIS — Z1211 Encounter for screening for malignant neoplasm of colon: Secondary | ICD-10-CM | POA: Insufficient documentation

## 2019-10-28 DIAGNOSIS — K219 Gastro-esophageal reflux disease without esophagitis: Secondary | ICD-10-CM | POA: Diagnosis not present

## 2019-10-28 NOTE — Patient Instructions (Signed)
1. Colonoscopy as scheduled. See separate instructions.  2. For chronic reflux, consider upper endoscopy to screen for Barrett's esophagus. As discussed today, we can address after you have completed your colonoscopy.  3. Return to the office in three months.

## 2019-10-28 NOTE — Progress Notes (Signed)
Primary Care Physician:  Sharion Balloon, FNP  Primary Gastroenterologist:  Elon Alas. Abbey Chatters, DO   Chief Complaint  Patient presents with  . Colonoscopy    never had tcs    HPI:  Candace Torres is a 64 y.o. female here at the request of Evelina Dun, FNP for consideration of screening colonoscopy.  No bowel concerns. BMs regular. No melena, brbpr. No abdominal pain. No weight loss. On chronic omeprazole for reflux for greater than five year.  Controls heartburn but if he misses dose then he has recurrent symptoms. No dysphagia. No prior colonoscopy. No FH of colon cancer or polyps.   Current Outpatient Medications  Medication Sig Dispense Refill  . buPROPion (WELLBUTRIN XL) 300 MG 24 hr tablet Take 1 tablet (300 mg total) by mouth daily. 90 tablet 0  . conjugated estrogens (PREMARIN) vaginal cream Place 1 Applicatorful vaginally daily. 42.5 g 12  . levothyroxine (SYNTHROID) 75 MCG tablet TAKE 1 TABLET BY MOUTH IN THE MORNING ON AN EMPTY STOMACH 90 tablet 2  . omeprazole (PRILOSEC) 40 MG capsule Take 1 capsule (40 mg total) by mouth daily. 90 capsule 3  . PARoxetine (PAXIL-CR) 25 MG 24 hr tablet Take 1 tablet (25 mg total) by mouth daily. 90 tablet 3  . spironolactone (ALDACTONE) 25 MG tablet Take 1 tablet (25 mg total) by mouth daily. 90 tablet 3   No current facility-administered medications for this visit.    Allergies as of 10/28/2019  . (No Known Allergies)    Past Medical History:  Diagnosis Date  . Depression   . GERD (gastroesophageal reflux disease)   . Hypothyroidism     Past Surgical History:  Procedure Laterality Date  . CHOLECYSTECTOMY      Family History  Problem Relation Age of Onset  . Depression Mother   . Hypertension Mother   . COPD Father   . Colon cancer Neg Hx   . Colon polyps Neg Hx     Social History   Socioeconomic History  . Marital status: Married    Spouse name: Not on file  . Number of children: Not on file  . Years of education:  Not on file  . Highest education level: Not on file  Occupational History  . Not on file  Tobacco Use  . Smoking status: Former Research scientist (life sciences)  . Smokeless tobacco: Never Used  Vaping Use  . Vaping Use: Never used  Substance and Sexual Activity  . Alcohol use: No  . Drug use: No  . Sexual activity: Not on file  Other Topics Concern  . Not on file  Social History Narrative  . Not on file   Social Determinants of Health   Financial Resource Strain:   . Difficulty of Paying Living Expenses:   Food Insecurity:   . Worried About Charity fundraiser in the Last Year:   . Arboriculturist in the Last Year:   Transportation Needs:   . Film/video editor (Medical):   Marland Kitchen Lack of Transportation (Non-Medical):   Physical Activity:   . Days of Exercise per Week:   . Minutes of Exercise per Session:   Stress:   . Feeling of Stress :   Social Connections:   . Frequency of Communication with Friends and Family:   . Frequency of Social Gatherings with Friends and Family:   . Attends Religious Services:   . Active Member of Clubs or Organizations:   . Attends Archivist Meetings:   .  Marital Status:   Intimate Partner Violence:   . Fear of Current or Ex-Partner:   . Emotionally Abused:   Marland Kitchen Physically Abused:   . Sexually Abused:       ROS:  General: Negative for anorexia, weight loss, fever, chills, fatigue, weakness. Eyes: Negative for vision changes.  ENT: Negative for hoarseness, difficulty swallowing , nasal congestion. CV: Negative for chest pain, angina, palpitations, dyspnea on exertion, peripheral edema.  Respiratory: Negative for dyspnea at rest, dyspnea on exertion, cough, sputum, wheezing.  GI: See history of present illness. GU:  Negative for dysuria, hematuria, urinary incontinence, urinary frequency, nocturnal urination.  MS: Negative for joint pain, low back pain.  Derm: Negative for rash or itching.  Neuro: Negative for weakness, abnormal sensation,  seizure, frequent headaches, memory loss, confusion.  Psych: Negative for anxiety, depression, suicidal ideation, hallucinations.  Endo: Negative for unusual weight change.  Heme: Negative for bruising or bleeding. Allergy: Negative for rash or hives.    Physical Examination:  BP (!) 130/67   Pulse 80   Temp 98.8 F (37.1 C) (Oral)   Ht 5\' 2"  (1.575 m)   Wt 154 lb (69.9 kg)   BMI 28.17 kg/m    General: Well-nourished, well-developed in no acute distress.  Head: Normocephalic, atraumatic.   Eyes: Conjunctiva pink, no icterus. Mouth: masked Neck: Supple without thyromegaly, masses, or lymphadenopathy.  Lungs: Clear to auscultation bilaterally.  Heart: Regular rate and rhythm, no murmurs rubs or gallops.  Abdomen: Bowel sounds are normal, nontender, nondistended, no hepatosplenomegaly or masses, no abdominal bruits or    hernia , no rebound or guarding.   Rectal: not performed Extremities: No lower extremity edema. No clubbing or deformities.  Neuro: Alert and oriented x 4 , grossly normal neurologically.  Skin: Warm and dry, no rash or jaundice.   Psych: Alert and cooperative, normal mood and affect.  Labs: Lab Results  Component Value Date   CREATININE 1.36 (H) 07/25/2019   BUN 18 07/25/2019   NA 138 07/25/2019   K 4.4 07/25/2019   CL 102 07/25/2019   CO2 22 07/25/2019   Lab Results  Component Value Date   ALT 36 (H) 07/25/2019   AST 32 07/25/2019   ALKPHOS 91 07/25/2019   BILITOT 0.6 07/25/2019   Lab Results  Component Value Date   WBC 10.3 07/25/2019   HGB 14.4 07/25/2019   HCT 41.5 07/25/2019   MCV 89 07/25/2019   PLT 257 07/25/2019     Imaging Studies: No results found.  Impression/Plan:  64 y/o female presenting to schedule first ever colonoscopy. She is average risk screening. Plan on colonoscopy with Dr. Abbey Chatters in near future. ASA II.  I have discussed the risks, alternatives, benefits with regards to but not limited to the risk of reaction to  medication, bleeding, infection, perforation and the patient is agreeable to proceed. Written consent to be obtained.  Chronic GERD on PPI for over five years. Has not been able to come off PPI. No prior EGD. Discussed consideration of EGD at some point in near future to screen for Barrett's given multiple risk factors. Patient would like to consider at a later date but prefers to focus on colonoscopy right now. Return to the office in 3 months.

## 2019-10-29 NOTE — Progress Notes (Signed)
Cc'ed to pcp °

## 2019-11-05 ENCOUNTER — Telehealth: Payer: Self-pay | Admitting: *Deleted

## 2019-11-05 NOTE — Telephone Encounter (Signed)
Pt called in and wanted to cancel her procedure for 11/24/19.  She said that she can't get off work on her prep day.  She would like a call back when our Sept procedure schedules are available.  Called [facility] Sauer to notify her and routing to clinical pool.

## 2019-11-11 ENCOUNTER — Telehealth: Payer: Self-pay | Admitting: *Deleted

## 2019-11-11 ENCOUNTER — Encounter (HOSPITAL_COMMUNITY): Admission: RE | Admit: 2019-11-11 | Payer: BC Managed Care – PPO | Source: Ambulatory Visit

## 2019-11-11 NOTE — Telephone Encounter (Signed)
LMOVM for pt. She needs to be rescheduled for her TCS with propofol with Dr. Abbey Chatters that she cancelled.

## 2019-11-12 NOTE — Telephone Encounter (Signed)
Letter mailed

## 2019-11-17 DIAGNOSIS — Z1231 Encounter for screening mammogram for malignant neoplasm of breast: Secondary | ICD-10-CM | POA: Diagnosis not present

## 2019-11-21 ENCOUNTER — Other Ambulatory Visit (HOSPITAL_COMMUNITY): Payer: BC Managed Care – PPO

## 2019-11-24 ENCOUNTER — Ambulatory Visit (HOSPITAL_COMMUNITY): Admit: 2019-11-24 | Payer: BC Managed Care – PPO

## 2019-11-24 ENCOUNTER — Encounter (HOSPITAL_COMMUNITY): Payer: Self-pay

## 2019-11-24 SURGERY — COLONOSCOPY WITH PROPOFOL
Anesthesia: Monitor Anesthesia Care

## 2019-12-09 DIAGNOSIS — R509 Fever, unspecified: Secondary | ICD-10-CM | POA: Diagnosis not present

## 2019-12-09 DIAGNOSIS — R05 Cough: Secondary | ICD-10-CM | POA: Diagnosis not present

## 2019-12-09 DIAGNOSIS — R5383 Other fatigue: Secondary | ICD-10-CM | POA: Diagnosis not present

## 2019-12-09 DIAGNOSIS — J029 Acute pharyngitis, unspecified: Secondary | ICD-10-CM | POA: Diagnosis not present

## 2019-12-13 DIAGNOSIS — I959 Hypotension, unspecified: Secondary | ICD-10-CM | POA: Diagnosis not present

## 2019-12-13 DIAGNOSIS — J9601 Acute respiratory failure with hypoxia: Secondary | ICD-10-CM | POA: Diagnosis not present

## 2019-12-13 DIAGNOSIS — R0602 Shortness of breath: Secondary | ICD-10-CM | POA: Diagnosis not present

## 2019-12-13 DIAGNOSIS — E279 Disorder of adrenal gland, unspecified: Secondary | ICD-10-CM | POA: Diagnosis not present

## 2019-12-13 DIAGNOSIS — F419 Anxiety disorder, unspecified: Secondary | ICD-10-CM | POA: Diagnosis not present

## 2019-12-13 DIAGNOSIS — J1282 Pneumonia due to coronavirus disease 2019: Secondary | ICD-10-CM | POA: Diagnosis not present

## 2019-12-13 DIAGNOSIS — E278 Other specified disorders of adrenal gland: Secondary | ICD-10-CM | POA: Diagnosis not present

## 2019-12-13 DIAGNOSIS — K219 Gastro-esophageal reflux disease without esophagitis: Secondary | ICD-10-CM | POA: Diagnosis not present

## 2019-12-13 DIAGNOSIS — I2510 Atherosclerotic heart disease of native coronary artery without angina pectoris: Secondary | ICD-10-CM | POA: Diagnosis not present

## 2019-12-13 DIAGNOSIS — U071 COVID-19: Secondary | ICD-10-CM | POA: Diagnosis not present

## 2019-12-13 DIAGNOSIS — R000 Tachycardia, unspecified: Secondary | ICD-10-CM | POA: Diagnosis not present

## 2019-12-13 DIAGNOSIS — F329 Major depressive disorder, single episode, unspecified: Secondary | ICD-10-CM | POA: Diagnosis not present

## 2019-12-13 DIAGNOSIS — Z792 Long term (current) use of antibiotics: Secondary | ICD-10-CM | POA: Diagnosis not present

## 2019-12-13 DIAGNOSIS — R0902 Hypoxemia: Secondary | ICD-10-CM | POA: Diagnosis not present

## 2019-12-13 DIAGNOSIS — Z6828 Body mass index (BMI) 28.0-28.9, adult: Secondary | ICD-10-CM | POA: Diagnosis not present

## 2019-12-13 DIAGNOSIS — A419 Sepsis, unspecified organism: Secondary | ICD-10-CM | POA: Diagnosis not present

## 2019-12-13 DIAGNOSIS — I700 Atherosclerosis of aorta: Secondary | ICD-10-CM | POA: Diagnosis not present

## 2019-12-13 DIAGNOSIS — Z9049 Acquired absence of other specified parts of digestive tract: Secondary | ICD-10-CM | POA: Diagnosis not present

## 2019-12-13 DIAGNOSIS — R6520 Severe sepsis without septic shock: Secondary | ICD-10-CM | POA: Diagnosis not present

## 2019-12-13 DIAGNOSIS — Z79890 Hormone replacement therapy (postmenopausal): Secondary | ICD-10-CM | POA: Diagnosis not present

## 2019-12-13 DIAGNOSIS — E039 Hypothyroidism, unspecified: Secondary | ICD-10-CM | POA: Diagnosis not present

## 2019-12-14 DIAGNOSIS — Z6828 Body mass index (BMI) 28.0-28.9, adult: Secondary | ICD-10-CM | POA: Diagnosis not present

## 2019-12-14 DIAGNOSIS — U071 COVID-19: Secondary | ICD-10-CM | POA: Diagnosis not present

## 2019-12-14 DIAGNOSIS — K219 Gastro-esophageal reflux disease without esophagitis: Secondary | ICD-10-CM | POA: Diagnosis not present

## 2019-12-14 DIAGNOSIS — J1282 Pneumonia due to coronavirus disease 2019: Secondary | ICD-10-CM | POA: Diagnosis not present

## 2019-12-15 DIAGNOSIS — E278 Other specified disorders of adrenal gland: Secondary | ICD-10-CM | POA: Diagnosis not present

## 2019-12-15 DIAGNOSIS — K449 Diaphragmatic hernia without obstruction or gangrene: Secondary | ICD-10-CM | POA: Diagnosis not present

## 2019-12-15 DIAGNOSIS — I700 Atherosclerosis of aorta: Secondary | ICD-10-CM | POA: Diagnosis not present

## 2019-12-15 DIAGNOSIS — I358 Other nonrheumatic aortic valve disorders: Secondary | ICD-10-CM | POA: Diagnosis not present

## 2019-12-16 DIAGNOSIS — J1282 Pneumonia due to coronavirus disease 2019: Secondary | ICD-10-CM | POA: Diagnosis not present

## 2019-12-16 DIAGNOSIS — Z6828 Body mass index (BMI) 28.0-28.9, adult: Secondary | ICD-10-CM | POA: Diagnosis not present

## 2019-12-16 DIAGNOSIS — U071 COVID-19: Secondary | ICD-10-CM | POA: Diagnosis not present

## 2019-12-17 DIAGNOSIS — U071 COVID-19: Secondary | ICD-10-CM | POA: Diagnosis not present

## 2019-12-17 DIAGNOSIS — J1282 Pneumonia due to coronavirus disease 2019: Secondary | ICD-10-CM | POA: Diagnosis not present

## 2019-12-17 DIAGNOSIS — J9601 Acute respiratory failure with hypoxia: Secondary | ICD-10-CM | POA: Diagnosis not present

## 2019-12-18 DIAGNOSIS — J9601 Acute respiratory failure with hypoxia: Secondary | ICD-10-CM | POA: Diagnosis not present

## 2019-12-18 DIAGNOSIS — U071 COVID-19: Secondary | ICD-10-CM | POA: Diagnosis not present

## 2019-12-18 DIAGNOSIS — J1282 Pneumonia due to coronavirus disease 2019: Secondary | ICD-10-CM | POA: Diagnosis not present

## 2019-12-19 DIAGNOSIS — J1282 Pneumonia due to coronavirus disease 2019: Secondary | ICD-10-CM | POA: Diagnosis not present

## 2019-12-19 DIAGNOSIS — U071 COVID-19: Secondary | ICD-10-CM | POA: Diagnosis not present

## 2019-12-19 DIAGNOSIS — K219 Gastro-esophageal reflux disease without esophagitis: Secondary | ICD-10-CM | POA: Diagnosis not present

## 2020-01-01 ENCOUNTER — Other Ambulatory Visit: Payer: Self-pay

## 2020-01-01 ENCOUNTER — Ambulatory Visit (INDEPENDENT_AMBULATORY_CARE_PROVIDER_SITE_OTHER): Payer: BC Managed Care – PPO | Admitting: Family

## 2020-01-01 ENCOUNTER — Encounter: Payer: Self-pay | Admitting: Family

## 2020-01-01 VITALS — BP 117/75 | HR 112 | Temp 97.6°F | Ht 62.0 in

## 2020-01-01 DIAGNOSIS — U071 COVID-19: Secondary | ICD-10-CM

## 2020-01-01 DIAGNOSIS — Z23 Encounter for immunization: Secondary | ICD-10-CM | POA: Diagnosis not present

## 2020-01-01 DIAGNOSIS — Z8616 Personal history of COVID-19: Secondary | ICD-10-CM | POA: Diagnosis not present

## 2020-01-01 DIAGNOSIS — Z1211 Encounter for screening for malignant neoplasm of colon: Secondary | ICD-10-CM | POA: Diagnosis not present

## 2020-01-01 DIAGNOSIS — J1282 Pneumonia due to coronavirus disease 2019: Secondary | ICD-10-CM | POA: Diagnosis not present

## 2020-01-01 DIAGNOSIS — Z09 Encounter for follow-up examination after completed treatment for conditions other than malignant neoplasm: Secondary | ICD-10-CM

## 2020-01-01 NOTE — Patient Instructions (Addendum)
COVID-19 COVID-19 is a respiratory infection that is caused by a virus called severe acute respiratory syndrome coronavirus 2 (SARS-CoV-2). The disease is also known as coronavirus disease or novel coronavirus. In some people, the virus may not cause any symptoms. In others, it may cause a serious infection. The infection can get worse quickly and can lead to complications, such as:  Pneumonia, or infection of the lungs.  Acute respiratory distress syndrome or ARDS. This is a condition in which fluid build-up in the lungs prevents the lungs from filling with air and passing oxygen into the blood.  Acute respiratory failure. This is a condition in which there is not enough oxygen passing from the lungs to the body or when carbon dioxide is not passing from the lungs out of the body.  Sepsis or septic shock. This is a serious bodily reaction to an infection.  Blood clotting problems.  Secondary infections due to bacteria or fungus.  Organ failure. This is when your body's organs stop working. The virus that causes COVID-19 is contagious. This means that it can spread from person to person through droplets from coughs and sneezes (respiratory secretions). What are the causes? This illness is caused by a virus. You may catch the virus by:  Breathing in droplets from an infected person. Droplets can be spread by a person breathing, speaking, singing, coughing, or sneezing.  Touching something, like a table or a doorknob, that was exposed to the virus (contaminated) and then touching your mouth, nose, or eyes. What increases the risk? Risk for infection You are more likely to be infected with this virus if you:  Are within 6 feet (2 meters) of a person with COVID-19.  Provide care for or live with a person who is infected with COVID-19.  Spend time in crowded indoor spaces or live in shared housing. Risk for serious illness You are more likely to become seriously ill from the virus if you:   Are 50 years of age or older. The higher your age, the more you are at risk for serious illness.  Live in a nursing home or long-term care facility.  Have cancer.  Have a long-term (chronic) disease such as: ? Chronic lung disease, including chronic obstructive pulmonary disease or asthma. ? A long-term disease that lowers your body's ability to fight infection (immunocompromised). ? Heart disease, including heart failure, a condition in which the arteries that lead to the heart become narrow or blocked (coronary artery disease), a disease which makes the heart muscle thick, weak, or stiff (cardiomyopathy). ? Diabetes. ? Chronic kidney disease. ? Sickle cell disease, a condition in which red blood cells have an abnormal "sickle" shape. ? Liver disease.  Are obese. What are the signs or symptoms? Symptoms of this condition can range from mild to severe. Symptoms may appear any time from 2 to 14 days after being exposed to the virus. They include:  A fever or chills.  A cough.  Difficulty breathing.  Headaches, body aches, or muscle aches.  Runny or stuffy (congested) nose.  A sore throat.  New loss of taste or smell. Some people may also have stomach problems, such as nausea, vomiting, or diarrhea. Other people may not have any symptoms of COVID-19. How is this diagnosed? This condition may be diagnosed based on:  Your signs and symptoms, especially if: ? You live in an area with a COVID-19 outbreak. ? You recently traveled to or from an area where the virus is common. ? You   provide care for or live with a person who was diagnosed with COVID-19. ? You were exposed to a person who was diagnosed with COVID-19.  A physical exam.  Lab tests, which may include: ? Taking a sample of fluid from the back of your nose and throat (nasopharyngeal fluid), your nose, or your throat using a swab. ? A sample of mucus from your lungs (sputum). ? Blood tests.  Imaging tests, which  may include, X-rays, CT scan, or ultrasound. How is this treated? At present, there is no medicine to treat COVID-19. Medicines that treat other diseases are being used on a trial basis to see if they are effective against COVID-19. Your health care provider will talk with you about ways to treat your symptoms. For most people, the infection is mild and can be managed at home with rest, fluids, and over-the-counter medicines. Treatment for a serious infection usually takes places in a hospital intensive care unit (ICU). It may include one or more of the following treatments. These treatments are given until your symptoms improve.  Receiving fluids and medicines through an IV.  Supplemental oxygen. Extra oxygen is given through a tube in the nose, a face mask, or a hood.  Positioning you to lie on your stomach (prone position). This makes it easier for oxygen to get into the lungs.  Continuous positive airway pressure (CPAP) or bi-level positive airway pressure (BPAP) machine. This treatment uses mild air pressure to keep the airways open. A tube that is connected to a motor delivers oxygen to the body.  Ventilator. This treatment moves air into and out of the lungs by using a tube that is placed in your windpipe.  Tracheostomy. This is a procedure to create a hole in the neck so that a breathing tube can be inserted.  Extracorporeal membrane oxygenation (ECMO). This procedure gives the lungs a chance to recover by taking over the functions of the heart and lungs. It supplies oxygen to the body and removes carbon dioxide. Follow these instructions at home: Lifestyle  If you are sick, stay home except to get medical care. Your health care provider will tell you how long to stay home. Call your health care provider before you go for medical care.  Rest at home as told by your health care provider.  Do not use any products that contain nicotine or tobacco, such as cigarettes, e-cigarettes, and  chewing tobacco. If you need help quitting, ask your health care provider.  Return to your normal activities as told by your health care provider. Ask your health care provider what activities are safe for you. General instructions  Take over-the-counter and prescription medicines only as told by your health care provider.  Drink enough fluid to keep your urine pale yellow.  Keep all follow-up visits as told by your health care provider. This is important. How is this prevented?  There is no vaccine to help prevent COVID-19 infection. However, there are steps you can take to protect yourself and others from this virus. To protect yourself:   Do not travel to areas where COVID-19 is a risk. The areas where COVID-19 is reported change often. To identify high-risk areas and travel restrictions, check the CDC travel website: wwwnc.cdc.gov/travel/notices  If you live in, or must travel to, an area where COVID-19 is a risk, take precautions to avoid infection. ? Stay away from people who are sick. ? Wash your hands often with soap and water for 20 seconds. If soap and water   are not available, use an alcohol-based hand sanitizer. ? Avoid touching your mouth, face, eyes, or nose. ? Avoid going out in public, follow guidance from your state and local health authorities. ? If you must go out in public, wear a cloth face covering or face mask. Make sure your mask covers your nose and mouth. ? Avoid crowded indoor spaces. Stay at least 6 feet (2 meters) away from others. ? Disinfect objects and surfaces that are frequently touched every day. This may include:  Counters and tables.  Doorknobs and light switches.  Sinks and faucets.  Electronics, such as phones, remote controls, keyboards, computers, and tablets. To protect others: If you have symptoms of COVID-19, take steps to prevent the virus from spreading to others.  If you think you have a COVID-19 infection, contact your health care  provider right away. Tell your health care team that you think you may have a COVID-19 infection.  Stay home. Leave your house only to seek medical care. Do not use public transport.  Do not travel while you are sick.  Wash your hands often with soap and water for 20 seconds. If soap and water are not available, use alcohol-based hand sanitizer.  Stay away from other members of your household. Let healthy household members care for children and pets, if possible. If you have to care for children or pets, wash your hands often and wear a mask. If possible, stay in your own room, separate from others. Use a different bathroom.  Make sure that all people in your household wash their hands well and often.  Cough or sneeze into a tissue or your sleeve or elbow. Do not cough or sneeze into your hand or into the air.  Wear a cloth face covering or face mask. Make sure your mask covers your nose and mouth. Where to find more information  Centers for Disease Control and Prevention: www.cdc.gov/coronavirus/2019-ncov/index.html  World Health Organization: www.who.int/health-topics/coronavirus Contact a health care provider if:  You live in or have traveled to an area where COVID-19 is a risk and you have symptoms of the infection.  You have had contact with someone who has COVID-19 and you have symptoms of the infection. Get help right away if:  You have trouble breathing.  You have pain or pressure in your chest.  You have confusion.  You have bluish lips and fingernails.  You have difficulty waking from sleep.  You have symptoms that get worse. These symptoms may represent a serious problem that is an emergency. Do not wait to see if the symptoms will go away. Get medical help right away. Call your local emergency services (911 in the [place] Do not drive yourself to the hospital. Let the emergency medical personnel know if you think you have COVID-19. Summary  COVID-19 is a  respiratory infection that is caused by a virus. It is also known as coronavirus disease or novel coronavirus. It can cause serious infections, such as pneumonia, acute respiratory distress syndrome, acute respiratory failure, or sepsis.  The virus that causes COVID-19 is contagious. This means that it can spread from person to person through droplets from breathing, speaking, singing, coughing, or sneezing.  You are more likely to develop a serious illness if you are 50 years of age or older, have a weak immune system, live in a nursing home, or have chronic disease.  There is no medicine to treat COVID-19. Your health care provider will talk with you about ways to treat your symptoms.    Take steps to protect yourself and others from infection. Wash your hands often and disinfect objects and surfaces that are frequently touched every day. Stay away from people who are sick and wear a mask if you are sick. This information is not intended to replace advice given to you by your health care provider. Make sure you discuss any questions you have with your health care provider. Document Revised: 01/17/2019 Document Reviewed: 04/25/2018 Elsevier Patient Education  2020 [employer]  

## 2020-01-01 NOTE — Progress Notes (Signed)
Subjective:    Patient ID: Candace Torres, female    DOB: [birth year], 64 y.o.   MRN: [id number]  Chief Complaint  Patient presents with  . Hospitalization Follow-up   PT presents to the office today for hospital follow up. She went to the ED 12/13/19 with SOB and COVID pneumonia. She was diagnosed with COVID on 12/09/19 at the [facility]. She was discharged on 12/20/19 on home oxygen 4 L and doing home health.   She was given 5 days of remdesivir, olumiant, dexamethasone, pepcid, vitamin C, zinc, and albuterol inhaler, rocephin, and azithroymycin. She was placed on 15 L of high flow oxygen. She was discharged on home O2 at 4 L for 7 days then decrease 3 L. She was also given zpak, Eliquis BID for 30 days, and dexamethasone.  Shortness of Breath This is a new problem. The current episode started more than 1 month ago. The problem has been gradually improving. Pertinent negatives include no chest pain, ear pain, fever, leg pain or leg swelling.      Review of Systems  Constitutional: Negative for fever.  HENT: Negative for ear pain.   Respiratory: Positive for shortness of breath.   Cardiovascular: Negative for chest pain and leg swelling.  All other systems reviewed and are negative.      Objective:   Physical Exam Vitals reviewed.  Constitutional:      General: She is not in acute distress.    Appearance: She is well-developed.  HENT:     Head: Normocephalic and atraumatic.     Right Ear: Tympanic membrane normal.     Left Ear: Tympanic membrane normal.  Eyes:     Pupils: Pupils are equal, round, and reactive to light.  Neck:     Thyroid: No thyromegaly.  Cardiovascular:     Rate and Rhythm: Normal rate and regular rhythm.     Heart sounds: Normal heart sounds. No murmur heard.   Pulmonary:     Effort: Pulmonary effort is normal. No respiratory distress.     Breath sounds: Normal breath sounds. No wheezing.     Comments: Currently on 4 l of O2  Abdominal:     General:  Bowel sounds are normal. There is no distension.     Palpations: Abdomen is soft.     Tenderness: There is no abdominal tenderness.  Musculoskeletal:        General: No tenderness. Normal range of motion.     Cervical back: Normal range of motion and neck supple.  Skin:    General: Skin is warm and dry.  Neurological:     Mental Status: She is alert and oriented to person, place, and time.     Cranial Nerves: No cranial nerve deficit.     Deep Tendon Reflexes: Reflexes are normal and symmetric.  Psychiatric:        Behavior: Behavior normal.        Thought Content: Thought content normal.        Judgment: Judgment normal.       BP 117/75   Pulse (!) 112   Temp 97.6 F (36.4 C)   Ht 5' 2"  (1.575 m)   SpO2 100% Comment: 4 liters o2  BMI 28.17 kg/m      Assessment & Plan:  Candace Torres comes in today with chief complaint of Hospitalization Follow-up   Diagnosis and orders addressed:  1. Hospital discharge follow-up - CBC with Differential/Platelet - CMP14+EGFR  2. Pneumonia due to  COVID-19 virus - CBC with Differential/Platelet - CMP14+EGFR  3. Colon cancer screening - Cologuard - CBC with Differential/Platelet - CMP14+EGFR   While in the office we decreased her O2 to 2 L. She will continue to wean and if sitting and not feeling SOB she will not use oxygen.  Continue Eliquis  Will write her out until 01/21/20 and we will fax paperwork  Labs pending Health Maintenance reviewed Diet and exercise encouraged  Follow up plan: 1 month   Evelina Dun, FNP

## 2020-01-02 LAB — CMP14+EGFR
ALT: 52 IU/L — ABNORMAL HIGH (ref 0–32)
AST: 34 IU/L (ref 0–40)
Albumin/Globulin Ratio: 1.6 (ref 1.2–2.2)
Albumin: 3.9 g/dL (ref 3.8–4.8)
Alkaline Phosphatase: 83 IU/L (ref 44–121)
BUN/Creatinine Ratio: 13 (ref 12–28)
BUN: 16 mg/dL (ref 8–27)
Bilirubin Total: 0.4 mg/dL (ref 0.0–1.2)
CO2: 26 mmol/L (ref 20–29)
Calcium: 9.9 mg/dL (ref 8.7–10.3)
Chloride: 103 mmol/L (ref 96–106)
Creatinine, Ser: 1.25 mg/dL — ABNORMAL HIGH (ref 0.57–1.00)
GFR calc Af Amer: 52 mL/min/{1.73_m2} — ABNORMAL LOW (ref >59)
GFR calc non Af Amer: 45 mL/min/{1.73_m2} — ABNORMAL LOW (ref >59)
Globulin, Total: 2.4 g/dL (ref 1.5–4.5)
Glucose: 105 mg/dL — ABNORMAL HIGH (ref 65–99)
Potassium: 4.4 mmol/L (ref 3.5–5.2)
Sodium: 141 mmol/L (ref 134–144)
Total Protein: 6.3 g/dL (ref 6.0–8.5)

## 2020-01-02 LAB — CBC WITH DIFFERENTIAL/PLATELET
Basophils Absolute: 0.1 10*3/uL (ref 0.0–0.2)
Basos: 1 %
EOS (ABSOLUTE): 0.5 10*3/uL — ABNORMAL HIGH (ref 0.0–0.4)
Eos: 5 %
Hematocrit: 37.9 % (ref 34.0–46.6)
Hemoglobin: 12.9 g/dL (ref 11.1–15.9)
Immature Grans (Abs): 0.3 10*3/uL — ABNORMAL HIGH (ref 0.0–0.1)
Immature Granulocytes: 2 %
Lymphocytes Absolute: 2 10*3/uL (ref 0.7–3.1)
Lymphs: 17 %
MCH: 31 pg (ref 26.6–33.0)
MCHC: 34 g/dL (ref 31.5–35.7)
MCV: 91 fL (ref 79–97)
Monocytes Absolute: 1.2 10*3/uL — ABNORMAL HIGH (ref 0.1–0.9)
Monocytes: 11 %
Neutrophils Absolute: 7.3 10*3/uL — ABNORMAL HIGH (ref 1.4–7.0)
Neutrophils: 64 %
Platelets: 200 10*3/uL (ref 150–450)
RBC: 4.16 x10E6/uL (ref 3.77–5.28)
RDW: 13.6 % (ref 11.7–15.4)
WBC: 11.4 10*3/uL — ABNORMAL HIGH (ref 3.4–10.8)

## 2020-01-05 ENCOUNTER — Telehealth: Payer: Self-pay

## 2020-01-05 ENCOUNTER — Encounter: Payer: Self-pay | Admitting: Internal Medicine

## 2020-01-05 NOTE — Telephone Encounter (Signed)
Ok, we will hold off on [place] at this time. Please schedule her a follow up appt in 4-6 months to recheck liver enzymes. If elevated will do [place].

## 2020-01-05 NOTE — Telephone Encounter (Signed)
Patient aware and states she has an appointment on 11/2 and would like to recheck labs then if possible.

## 2020-01-12 ENCOUNTER — Telehealth: Payer: Self-pay

## 2020-01-12 NOTE — Telephone Encounter (Signed)
Extend to our next visit and we will discuss. I will extend her note as long as we need to.

## 2020-01-12 NOTE — Telephone Encounter (Signed)
Ok to extend work note. Please make sure she keeps her follow up with me.

## 2020-01-12 NOTE — Telephone Encounter (Signed)
Patient is wanting to know how long you will extend the note for ?

## 2020-01-13 NOTE — Telephone Encounter (Signed)
Patient awar. Note extended and faxed to [facility]

## 2020-01-15 ENCOUNTER — Ambulatory Visit (INDEPENDENT_AMBULATORY_CARE_PROVIDER_SITE_OTHER): Payer: BC Managed Care – PPO | Admitting: Family

## 2020-01-15 ENCOUNTER — Encounter: Payer: Self-pay | Admitting: Family

## 2020-01-15 ENCOUNTER — Other Ambulatory Visit: Payer: Self-pay | Admitting: Family

## 2020-01-15 ENCOUNTER — Telehealth: Payer: Self-pay | Admitting: Family Medicine

## 2020-01-15 DIAGNOSIS — I471 Supraventricular tachycardia, unspecified: Secondary | ICD-10-CM

## 2020-01-15 DIAGNOSIS — Z8616 Personal history of COVID-19: Secondary | ICD-10-CM

## 2020-01-15 DIAGNOSIS — R000 Tachycardia, unspecified: Secondary | ICD-10-CM

## 2020-01-15 DIAGNOSIS — R079 Chest pain, unspecified: Secondary | ICD-10-CM

## 2020-01-15 DIAGNOSIS — R7981 Abnormal blood-gas level: Secondary | ICD-10-CM | POA: Diagnosis not present

## 2020-01-15 NOTE — Telephone Encounter (Signed)
Patient aware- appointment  scheduled.

## 2020-01-15 NOTE — Telephone Encounter (Signed)
Please schedule patient a telephone visit with me today. I may need to order scan to rule out PE.

## 2020-01-15 NOTE — Progress Notes (Addendum)
   Virtual Visit via telephone Note Due to COVID-19 pandemic this visit was conducted virtually. This visit type was conducted due to national recommendations for restrictions regarding the COVID-19 Pandemic (e.g. social distancing, sheltering in place) in an effort to limit this patient's exposure and mitigate transmission in our community. All issues noted in this document were discussed and addressed.  A physical exam was not performed with this format.  I connected with Candace Torres on 01/15/20 at 1:40 pm  by telephone and verified that I am speaking with the correct person using two identifiers. Candace Torres is currently located at home and no one is currently with her during visit. The provider, Evelina Dun, FNP is located in their office at time of visit.  I discussed the limitations, risks, security and privacy concerns of performing an evaluation and management service by telephone and the availability of in person appointments. I also discussed with the patient that there may be a patient responsible charge related to this service. The patient expressed understanding and agreed to proceed.   History and Present Illness:  HPI Pt calls the office today with elevated heart rate that started this week. She states when she took her pulse earlier it was 120's, but now it is 90. She had COVID 12/09/19 and was hospitalized. She was discharged from the hospital on 12/20/19 with home oxygen. She has stopped this 01/12/20  and reports her O2 saturation 94-96%. She denies SOB, cough, edema, or chest pain.  Denies any hx of DVT.   She continues to take her Eliquis BID.   ROS   Observations/Objective: No SOB or distress noted   Assessment and Plan: 1. Tachycardia - CBC with Differential/Platelet - EKG 12-Lead - CMP14+EGFR  2. History of COVID-19 - CBC with Differential/Platelet - EKG 12-Lead - CMP14+EGFR    Wells Criteria 4 Worrisome for PE, but pt denies any SOB and she has been  on Eliquis Will have her come do EKG and repeat lab work If any increased SOB, chest pain, or increased tachycardia go to ED   Addendum: I have ordered CT to rule out PE. Insurance denied.  They want her to have chest x-ray first then hopefully will do CT to rule out PE.   I discussed the assessment and treatment plan with the patient. The patient was provided an opportunity to ask questions and all were answered. The patient agreed with the plan and demonstrated an understanding of the instructions.   The patient was advised to call back or seek an in-person evaluation if the symptoms worsen or if the condition fails to improve as anticipated.  The above assessment and management plan was discussed with the patient. The patient verbalized understanding of and has agreed to the management plan. Patient is aware to call the clinic if symptoms persist or worsen. Patient is aware when to return to the clinic for a follow-up visit. Patient educated on when it is appropriate to go to the emergency department.   Time call ended:  1:55 pm   I provided 15 minutes of non-face-to-face time during this encounter.    Evelina Dun, FNP

## 2020-01-15 NOTE — Addendum Note (Signed)
Addended by: Evelina Dun A on: 01/15/2020 04:03 PM   Modules accepted: Orders

## 2020-01-16 ENCOUNTER — Telehealth: Payer: Self-pay | Admitting: *Deleted

## 2020-01-16 DIAGNOSIS — R000 Tachycardia, unspecified: Secondary | ICD-10-CM

## 2020-01-16 LAB — CBC WITH DIFFERENTIAL/PLATELET
Basophils Absolute: 0.2 10*3/uL (ref 0.0–0.2)
Basos: 2 %
EOS (ABSOLUTE): 0.4 10*3/uL (ref 0.0–0.4)
Eos: 4 %
Hematocrit: 38.7 % (ref 34.0–46.6)
Hemoglobin: 12.7 g/dL (ref 11.1–15.9)
Immature Grans (Abs): 0.1 10*3/uL (ref 0.0–0.1)
Immature Granulocytes: 1 %
Lymphocytes Absolute: 3.5 10*3/uL — ABNORMAL HIGH (ref 0.7–3.1)
Lymphs: 32 %
MCH: 29.9 pg (ref 26.6–33.0)
MCHC: 32.8 g/dL (ref 31.5–35.7)
MCV: 91 fL (ref 79–97)
Monocytes Absolute: 0.9 10*3/uL (ref 0.1–0.9)
Monocytes: 9 %
Neutrophils Absolute: 5.7 10*3/uL (ref 1.4–7.0)
Neutrophils: 52 %
Platelets: 258 10*3/uL (ref 150–450)
RBC: 4.25 x10E6/uL (ref 3.77–5.28)
RDW: 14.1 % (ref 11.7–15.4)
WBC: 10.7 10*3/uL (ref 3.4–10.8)

## 2020-01-16 LAB — CMP14+EGFR
ALT: 30 IU/L (ref 0–32)
AST: 28 IU/L (ref 0–40)
Albumin/Globulin Ratio: 1.7 (ref 1.2–2.2)
Albumin: 4.5 g/dL (ref 3.8–4.8)
Alkaline Phosphatase: 74 IU/L (ref 44–121)
BUN/Creatinine Ratio: 12 (ref 12–28)
BUN: 15 mg/dL (ref 8–27)
Bilirubin Total: 0.4 mg/dL (ref 0.0–1.2)
CO2: 22 mmol/L (ref 20–29)
Calcium: 9.9 mg/dL (ref 8.7–10.3)
Chloride: 105 mmol/L (ref 96–106)
Creatinine, Ser: 1.24 mg/dL — ABNORMAL HIGH (ref 0.57–1.00)
GFR calc Af Amer: 52 mL/min/{1.73_m2} — ABNORMAL LOW (ref >59)
GFR calc non Af Amer: 45 mL/min/{1.73_m2} — ABNORMAL LOW (ref >59)
Globulin, Total: 2.6 g/dL (ref 1.5–4.5)
Glucose: 110 mg/dL — ABNORMAL HIGH (ref 65–99)
Potassium: 4.4 mmol/L (ref 3.5–5.2)
Sodium: 140 mmol/L (ref 134–144)
Total Protein: 7.1 g/dL (ref 6.0–8.5)

## 2020-01-16 LAB — TSH: TSH: 4.86 u[IU]/mL — ABNORMAL HIGH (ref 0.450–4.500)

## 2020-01-16 NOTE — Telephone Encounter (Signed)
TC from [place] w/ [facility] Pt's HR with nurse & PT has been 100-115, pt and HH would like a referral to Cardiology for this to be evaluated before she returns to work

## 2020-01-17 DIAGNOSIS — U071 COVID-19: Secondary | ICD-10-CM | POA: Diagnosis not present

## 2020-01-17 DIAGNOSIS — J1282 Pneumonia due to coronavirus disease 2019: Secondary | ICD-10-CM | POA: Diagnosis not present

## 2020-01-19 ENCOUNTER — Telehealth: Payer: Self-pay

## 2020-01-19 NOTE — Telephone Encounter (Signed)
Placed referral to cardiology. 

## 2020-01-19 NOTE — Telephone Encounter (Signed)
Patient aware.

## 2020-01-19 NOTE — Addendum Note (Signed)
Addended by: Caryl Pina on: 01/19/2020 11:14 AM   Modules accepted: Orders

## 2020-01-24 LAB — EXTERNAL GENERIC LAB PROCEDURE

## 2020-01-24 LAB — COLOGUARD

## 2020-01-26 ENCOUNTER — Other Ambulatory Visit: Payer: Self-pay | Admitting: Family

## 2020-01-26 MED ORDER — LEVOTHYROXINE SODIUM 88 MCG PO TABS: 88.0000 ug | ORAL_TABLET | Freq: Every day | ORAL | 3 refills | Status: DC

## 2020-01-29 ENCOUNTER — Telehealth: Payer: Self-pay | Admitting: Family

## 2020-01-29 NOTE — Telephone Encounter (Signed)
Pt is requesting for her work leave to be extended

## 2020-01-29 NOTE — Telephone Encounter (Signed)
Patient states that her Candace Torres told her she did not need to go back to work until she seen Cardiology- she goes to see them 11/5- would like her work note extended until that appointment.  Please advise

## 2020-01-30 NOTE — Telephone Encounter (Signed)
Letter has been printed and will be faxed to [facility]- patient aware

## 2020-01-30 NOTE — Telephone Encounter (Signed)
Ok to extend work note 

## 2020-02-03 ENCOUNTER — Ambulatory Visit: Payer: BC Managed Care – PPO | Admitting: Family

## 2020-02-05 DIAGNOSIS — R000 Tachycardia, unspecified: Secondary | ICD-10-CM | POA: Insufficient documentation

## 2020-02-05 NOTE — Progress Notes (Signed)
Cardiology Office Note   Date:  02/06/2020   ID:  Candace Torres, DOB 1955-07-10, MRN [id number]  PCP:  Sharion Balloon, FNP  Cardiologist:   No primary care provider on file. Referring:  Dettinger, Fransisca Kaufmann, MD  Chief Complaint  Patient presents with  . Tachycardia      History of Present Illness: Candace Torres is a 64 y.o. female who is referred by Dettinger, Fransisca Kaufmann, MDfor evaluation of tachycardia.  The patient was in the hospital in September with Covid.  This was at [facility].  I was not able to see the discharge summary.  I was able to see some records including a negative troponin.  I do not see an echocardiogram or other testing.  Interestingly she was sent home on Eliquis.  It was not exactly clear to her primary provider why and she has been taken off of this.  The patient does have tachycardia and was referred for evaluation of this.  She is feeling this occasionally with heart rates in the 140s or 120s.  It happens sporadically.  It comes and goes by itself.  She checks her pulse ox and sees this.  It seems to be a more rapid onset and offset and not necessarily gradual.  It happens at rest and she cannot bring it on.  She does not have any presyncope or syncope.  She is not describing chest pressure, neck or arm discomfort.  She was sent home on oxygen for 3 weeks.  However, she is now not on this and her oxygen saturations are in the high 90s.  She does feel fatigued.  She does not think her breathing is quite at baseline.  She is not having any cough fevers or chills however.  She is not describing PND or orthopnea.  She has had no weight gain or edema.  Past Medical History:  Diagnosis Date  . Depression   . GERD (gastroesophageal reflux disease)   . Hypothyroidism     Past Surgical History:  Procedure Laterality Date  . CHOLECYSTECTOMY       Current Outpatient Medications  Medication Sig Dispense Refill  . Ascorbic Acid (VITAMIN C) 500 MG CAPS Take by  mouth.    Marland Kitchen buPROPion (WELLBUTRIN XL) 300 MG 24 hr tablet Take 1 tablet (300 mg total) by mouth daily. 90 tablet 0  . Cholecalciferol (VITAMIN D3) 25 MCG (1000 UT) CAPS Take by mouth.    . famotidine (PEPCID) 20 MG tablet Take 20 mg by mouth 2 (two) times daily.    Marland Kitchen levothyroxine (SYNTHROID) 88 MCG tablet Take 1 tablet (88 mcg total) by mouth daily. 90 tablet 3  . omeprazole (PRILOSEC) 40 MG capsule Take 1 capsule (40 mg total) by mouth daily. 90 capsule 3  . PARoxetine (PAXIL-CR) 37.5 MG 24 hr tablet Take 37.5 mg by mouth daily.    Marland Kitchen zinc gluconate 50 MG tablet Take 50 mg by mouth daily.     No current facility-administered medications for this visit.    Allergies:   Patient has no known allergies.    Social History:  The patient  reports that she has quit smoking. She has never used smokeless tobacco. She reports that she does not drink alcohol and does not use drugs.   Family History:  The patient's family history includes COPD in her father; Depression in her mother; Hypertension in her mother.    ROS:  Please see the history of present illness.  Otherwise, review of systems are positive for none.   All other systems are reviewed and negative.    PHYSICAL EXAM: VS:  BP 127/72   Pulse 86   Ht 5\' 2"  (1.575 m)   Wt 157 lb 3.2 oz (71.3 kg)   SpO2 98%   BMI 28.75 kg/m  , BMI Body mass index is 28.75 kg/m. GENERAL:  Well appearing HEENT:  Pupils equal round and reactive, fundi not visualized, oral mucosa unremarkable NECK:  No jugular venous distention, waveform within normal limits, carotid upstroke brisk and symmetric, no bruits, no thyromegaly LYMPHATICS:  No cervical, inguinal adenopathy LUNGS:  Clear to auscultation bilaterally BACK:  No CVA tenderness CHEST:  Unremarkable HEART:  PMI not displaced or sustained,S1 and S2 within normal limits, no S3, no S4, no clicks, no rubs, no murmurs ABD:  Flat, positive bowel sounds normal in frequency in pitch, no bruits, no rebound,  no guarding, no midline pulsatile mass, no hepatomegaly, no splenomegaly EXT:  2 plus pulses throughout, no edema, no cyanosis no clubbing SKIN:  No rashes no nodules NEURO:  Cranial nerves II through XII grossly intact, motor grossly intact throughout PSYCH:  Cognitively intact, oriented to person place and time    EKG:  EKG is ordered today. The ekg ordered today demonstrates sinus rhythm, rate 86, low voltage in the limb and chest leads.  No acute ST-T wave changes.  This is unchanged from previous.   Recent Labs: 01/15/2020: ALT 30; BUN 15; Creatinine, Ser 1.24; Hemoglobin 12.7; Platelets 258; Potassium 4.4; Sodium 140; TSH 4.860    Lipid Panel    Component Value Date/Time   CHOL 168 07/25/2019 1520   TRIG 156 (H) 07/25/2019 1520   TRIG 107 01/08/2014 1149   HDL 37 (L) 07/25/2019 1520   HDL 37 (L) 01/08/2014 1149   CHOLHDL 4.5 (H) 07/25/2019 1520   LDLCALC 103 (H) 07/25/2019 1520   LDLCALC 124 (H) 01/08/2014 1149      Wt Readings from Last 3 Encounters:  02/06/20 157 lb 3.2 oz (71.3 kg)  10/28/19 154 lb (69.9 kg)  07/25/19 157 lb 9.6 oz (71.5 kg)      Other studies Reviewed: Additional studies/ records that were reviewed today include: [facility] Records. Review of the above records demonstrates:  Please see elsewhere in the note.     ASSESSMENT AND PLAN:  TACHYCARDIA:    I need to see the [facility] records to understand why she was sent home on Eliquis.  I will get a release of information.  I was unable to find these in Care Everywhere.  She needs a 2-week event monitor.  I would also like to check an echocardiogram.  At this point no change in therapy however.  SOB: The plan will be as above.  I will check the echocardiogram.   Current medicines are reviewed at length with the patient today.  The patient does not have concerns regarding medicines.  The following changes have been made:  no change  Labs/ tests ordered today include:   Orders Placed This  Encounter  Procedures  . LONG TERM MONITOR (3-14 DAYS)  . EKG 12-Lead  . ECHOCARDIOGRAM COMPLETE     Disposition:   FU with in four weeks in [place].      Signed, Minus Breeding, MD  02/06/2020 2:59 PM    [place]

## 2020-02-06 ENCOUNTER — Other Ambulatory Visit: Payer: Self-pay

## 2020-02-06 ENCOUNTER — Encounter: Payer: Self-pay | Admitting: Cardiology

## 2020-02-06 ENCOUNTER — Ambulatory Visit: Payer: BC Managed Care – PPO | Admitting: Cardiology

## 2020-02-06 ENCOUNTER — Telehealth: Payer: Self-pay | Admitting: Radiology

## 2020-02-06 ENCOUNTER — Telehealth: Payer: Self-pay

## 2020-02-06 VITALS — BP 127/72 | HR 86 | Ht 62.0 in | Wt 157.2 lb

## 2020-02-06 DIAGNOSIS — R000 Tachycardia, unspecified: Secondary | ICD-10-CM

## 2020-02-06 DIAGNOSIS — R0602 Shortness of breath: Secondary | ICD-10-CM | POA: Insufficient documentation

## 2020-02-06 NOTE — Patient Instructions (Addendum)
Medication Instructions:  No changes *If you need a refill on your cardiac medications before your next appointment, please call your pharmacy*   Lab Work: None If you have labs (blood work) drawn today and your tests are completely normal, you will receive your results only by: Marland Kitchen MyChart Message (if you have MyChart) OR . A paper copy in the mail If you have any lab test that is abnormal or we need to change your treatment, we will call you to review the results.   Testing/Procedures:  Your physician has requested that you have an echocardiogram. Echocardiography is a painless test that uses sound waves to create images of your heart. It provides your doctor with information about the size and shape of your heart and how well your heart's chambers and valves are working. This procedure takes approximately one hour. There are no restrictions for this procedure. This test is performed at [address] Monitor Instructions   Your physician has requested you wear your ZIO patch monitor___14____days.   This is a single patch monitor.  [employer] supplies one patch monitor per enrollment.  Additional stickers are not available.   Please do not apply patch if you will be having a Nuclear Stress Test, Echocardiogram, Cardiac CT, MRI, or Chest Xray during the time frame you would be wearing the monitor. The patch cannot be worn during these tests.  You cannot remove and re-apply the ZIO XT patch monitor.   Your ZIO patch monitor will be sent USPS Priority mail from [facility] directly to your home address. The monitor may also be mailed to a PO BOX if home delivery is not available.   It may take 3-5 days to receive your monitor after you have been enrolled.   Once you have received you monitor, please review enclosed instructions.  Your monitor has already been registered assigning a specific monitor serial # to you.   Applying the monitor   Shave hair from  upper left chest.   Hold abrader disc by orange tab.  Rub abrader in 40 strokes over left upper chest as indicated in your monitor instructions.   Clean area with 4 enclosed alcohol pads .  Use all pads to assure are is cleaned thoroughly.  Let dry.   Apply patch as indicated in monitor instructions.  Patch will be place under collarbone on left side of chest with arrow pointing upward.   Rub patch adhesive wings for 2 minutes.Remove white label marked "1".  Remove white label marked "2".  Rub patch adhesive wings for 2 additional minutes.   While looking in a mirror, press and release button in center of patch.  A small green light will flash 3-4 times .  This will be your only indicator the monitor has been turned on.     Do not shower for the first 24 hours.  You may shower after the first 24 hours.   Press button if you feel a symptom. You will hear a small click.  Record Date, Time and Symptom in the Patient Log Book.   When you are ready to remove patch, follow instructions on last 2 pages of Patient Log Book.  Stick patch monitor onto last page of Patient Log Book.   Place Patient Log Book in [place] box.  Use locking tab on box and tape box closed securely.  The [employer] has [employer] on it.  Please place in mailbox  as soon as possible.  Your physician should have your test results approximately 7 days after the monitor has been mailed back to [facility].   Call [place] at [phone] if you have questions regarding your ZIO XT patch monitor.  Call them immediately if you see an orange light blinking on your monitor.   If your monitor falls off in less than 4 days contact our Monitor department at [phone].  If your monitor becomes loose or falls off after 4 days call [employer] at [phone] for suggestions on securing your monitor.     Follow-Up: At [facility], you and your health needs are our priority.  As part of our  continuing mission to provide you with exceptional heart care, we have created designated Provider Care Teams.  These Care Teams include your primary Cardiologist (physician) and Advanced Practice Providers (APPs -  Physician Assistants and Nurse Practitioners) who all work together to provide you with the care you need, when you need it.  We recommend signing up for the patient portal called "MyChart".  Sign up information is provided on this After Visit Summary.  MyChart is used to connect with patients for Virtual Visits (Telemedicine).  Patients are able to view lab/test results, encounter notes, upcoming appointments, etc.  Non-urgent messages can be sent to your provider as well.   To learn more about what you can do with MyChart, go to [email or web address].    Your next appointment:   Approximately 4 week(s) in [place]  The format for your next appointment:   In Person  Provider:   Minus Breeding, MD   Other Instructions None

## 2020-02-06 NOTE — Telephone Encounter (Signed)
Enrolled patient for a 14 day Zio XT Monitor to be mailed to patients home  

## 2020-02-09 ENCOUNTER — Ambulatory Visit (INDEPENDENT_AMBULATORY_CARE_PROVIDER_SITE_OTHER): Payer: BC Managed Care – PPO

## 2020-02-09 ENCOUNTER — Ambulatory Visit: Payer: BC Managed Care – PPO | Admitting: Family

## 2020-02-09 ENCOUNTER — Other Ambulatory Visit: Payer: Self-pay

## 2020-02-09 DIAGNOSIS — Z9981 Dependence on supplemental oxygen: Secondary | ICD-10-CM

## 2020-02-09 DIAGNOSIS — Z8616 Personal history of COVID-19: Secondary | ICD-10-CM

## 2020-02-09 DIAGNOSIS — Z7901 Long term (current) use of anticoagulants: Secondary | ICD-10-CM

## 2020-02-09 DIAGNOSIS — J1282 Pneumonia due to coronavirus disease 2019: Secondary | ICD-10-CM

## 2020-02-09 DIAGNOSIS — F329 Major depressive disorder, single episode, unspecified: Secondary | ICD-10-CM

## 2020-02-09 DIAGNOSIS — M6281 Muscle weakness (generalized): Secondary | ICD-10-CM

## 2020-02-09 DIAGNOSIS — F419 Anxiety disorder, unspecified: Secondary | ICD-10-CM

## 2020-02-09 DIAGNOSIS — E279 Disorder of adrenal gland, unspecified: Secondary | ICD-10-CM

## 2020-02-09 DIAGNOSIS — J9601 Acute respiratory failure with hypoxia: Secondary | ICD-10-CM | POA: Diagnosis not present

## 2020-02-09 DIAGNOSIS — E079 Disorder of thyroid, unspecified: Secondary | ICD-10-CM

## 2020-02-09 DIAGNOSIS — Z87891 Personal history of nicotine dependence: Secondary | ICD-10-CM

## 2020-02-09 DIAGNOSIS — K219 Gastro-esophageal reflux disease without esophagitis: Secondary | ICD-10-CM

## 2020-02-10 ENCOUNTER — Other Ambulatory Visit (INDEPENDENT_AMBULATORY_CARE_PROVIDER_SITE_OTHER): Payer: BC Managed Care – PPO

## 2020-02-10 DIAGNOSIS — R000 Tachycardia, unspecified: Secondary | ICD-10-CM

## 2020-02-10 DIAGNOSIS — R0602 Shortness of breath: Secondary | ICD-10-CM

## 2020-02-13 ENCOUNTER — Telehealth: Payer: Self-pay | Admitting: Cardiology

## 2020-02-13 NOTE — Telephone Encounter (Signed)
Returned call to patient explained she is not wearing a monitor that transmits so I am unable to tell if its working. Patient did state she saw a green light flash when she applied the monitor. Asked her to look in the mirror on occasion and if sees yellow or red blinking light then there would be an issue.

## 2020-02-13 NOTE — Telephone Encounter (Signed)
    Pt would like to know if her heart monitor is working

## 2020-02-19 ENCOUNTER — Telehealth: Payer: Self-pay | Admitting: Cardiology

## 2020-02-19 NOTE — Telephone Encounter (Signed)
Routing to MD.

## 2020-02-19 NOTE — Telephone Encounter (Signed)
Yes. OK to go back to work wearing the monitor.

## 2020-02-19 NOTE — Telephone Encounter (Signed)
Candace Torres is calling stating Dr. Percival Spanish took her out of work for 2 weeks so she is due to go back on Monday, but she is still currently wearing a heart monitor. [place] is wanting to know if she is to go back to work still wearing the heart monitor or if she should wait until she is done wearing it, which would be Wednesday. Please advise.

## 2020-02-19 NOTE — Telephone Encounter (Signed)
Per DPR, left detailed message stating that Per MD, pt is able to return to work as scheduled while still wearing her heart monitor. Instructed pt to call back should she have any further questions or concerns.

## 2020-02-25 ENCOUNTER — Telehealth: Payer: Self-pay | Admitting: Cardiology

## 2020-02-25 DIAGNOSIS — Z0279 Encounter for issue of other medical certificate: Secondary | ICD-10-CM

## 2020-02-25 NOTE — Telephone Encounter (Signed)
Received forms from [employer] on 02/25/2020. Put copy in providers box for completion.

## 2020-03-01 ENCOUNTER — Ambulatory Visit (HOSPITAL_COMMUNITY): Payer: BC Managed Care – PPO | Attending: Cardiology

## 2020-03-01 ENCOUNTER — Other Ambulatory Visit: Payer: Self-pay

## 2020-03-01 DIAGNOSIS — R0602 Shortness of breath: Secondary | ICD-10-CM | POA: Insufficient documentation

## 2020-03-01 LAB — ECHOCARDIOGRAM COMPLETE
Area-P 1/2: 3.31 cm2
P 1/2 time: 437 msec
S' Lateral: 2.1 cm

## 2020-03-02 DIAGNOSIS — R000 Tachycardia, unspecified: Secondary | ICD-10-CM | POA: Diagnosis not present

## 2020-03-03 ENCOUNTER — Institutional Professional Consult (permissible substitution): Payer: BC Managed Care – PPO | Admitting: Pulmonary Disease

## 2020-03-15 ENCOUNTER — Telehealth: Payer: Self-pay

## 2020-03-15 NOTE — Telephone Encounter (Signed)
The patient has been notified of the result and verbalized understanding.  All questions (if any) were answered.  Patient states that her home health nurse told her to stop taking her blood thinner approximately two months ago.   Advised patient to mention that at upcoming 12/15 appointment with Dr. Percival Spanish, will route to Dr. Percival Spanish for further advisement.  Wilma Flavin, RN 03/15/2020 2:50 PM

## 2020-03-15 NOTE — Telephone Encounter (Signed)
-----   Message from Minus Breeding, MD sent at 03/14/2020  8:35 PM EST ----- The monitor looks like there could have been a run of atrial flutter.   I will likely need a longer term monitor again an a couple of months before I stop the blood thinner and I would like for her to get a wearable of her own to look for arrhythmias.  I was able, after this result, to go back and look through [facility] notes and I see that she was sent home on Eliquis but I cannot see why.  She did not have a PE or DVT as far as I can tell.   Call Ms. Belleau with the results and send results to Sharion Balloon, FNP

## 2020-03-16 ENCOUNTER — Telehealth: Payer: Self-pay | Admitting: Cardiology

## 2020-03-16 NOTE — Telephone Encounter (Signed)
Spoke with patient. Forms have been completed and will be sent in today. Forms given to [facility] who will give to [facility].

## 2020-03-16 NOTE — Telephone Encounter (Signed)
Patient following up on forms. States she spoke to [employer] who states that they have not received the forms yet.

## 2020-03-16 NOTE — Telephone Encounter (Signed)
Forms received back from provider, faxed to [employer] on 12/14.

## 2020-03-16 NOTE — Progress Notes (Signed)
Cardiology Office Note   Date:  03/17/2020   ID:  TAMICO MUNDO, DOB 02-26-1956, MRN [id number]  PCP:  Sharion Balloon, FNP  Cardiologist:   No primary care provider on file. Referring:  Sharion Balloon, FNP  Chief Complaint  Patient presents with  . Tachycardia      History of Present Illness: Candace Torres is a 64 y.o. female who is referred by Sharion Balloon, FNPfor evaluation of tachycardia.  The patient was in the hospital in September with Covid.  This was at [facility]  I was able to see the discharge summary.  I was able to see some records including a negative troponin.  I do not see an echocardiogram or other testing.  Interestingly she was sent home on Eliquis.  It was not exactly clear to her primary provider why and she has been taken off of this.  I do note that there was no evidence of a pulmonary embolism and I do not see any mention of arrhythmia.  There was no DVT.  I sent her for a monitor looks like there could have been a run of atrial flutter.   However, since wearing the monitor she has been doing better.  She is not noticing any rapid heart rates.  She has not had any of the rates of 120s or 140s that she was having.  She is not having any presyncope or syncope.  She denies any chest pressure, neck or arm discomfort.  She gets some fleeting chest discomfort but she works in a vigorous job and she cannot bring on her symptoms.  Past Medical History:  Diagnosis Date  . Depression   . GERD (gastroesophageal reflux disease)   . Hypothyroidism     Past Surgical History:  Procedure Laterality Date  . CHOLECYSTECTOMY       Current Outpatient Medications  Medication Sig Dispense Refill  . Ascorbic Acid (VITAMIN C) 500 MG CAPS Take by mouth.    Marland Kitchen buPROPion (WELLBUTRIN XL) 300 MG 24 hr tablet Take 1 tablet (300 mg total) by mouth daily. 90 tablet 0  . levothyroxine (SYNTHROID) 88 MCG tablet Take 1 tablet (88 mcg total) by mouth daily. 90 tablet 3  .  omeprazole (PRILOSEC) 40 MG capsule Take 1 capsule (40 mg total) by mouth daily. 90 capsule 3  . PARoxetine (PAXIL-CR) 37.5 MG 24 hr tablet Take 37.5 mg by mouth daily.    Marland Kitchen zinc gluconate 50 MG tablet Take 50 mg by mouth daily.     No current facility-administered medications for this visit.    Allergies:   Patient has no known allergies.    ROS:  Please see the history of present illness.   Otherwise, review of systems are positive for none.   All other systems are reviewed and negative.    PHYSICAL EXAM: VS:  BP 140/85   Pulse 99   Ht 5\' 7"  (1.702 m)   Wt 151 lb 3.2 oz (68.6 kg)   SpO2 96%   BMI 23.68 kg/m  , BMI Body mass index is 23.68 kg/m. GENERAL:  Well appearing NECK:  No jugular venous distention, waveform within normal limits, carotid upstroke brisk and symmetric, no bruits, no thyromegaly LUNGS:  Clear to auscultation bilaterally CHEST:  Unremarkable HEART:  PMI not displaced or sustained,S1 and S2 within normal limits, no S3, no S4, no clicks, no rubs, no murmurs ABD:  Flat, positive bowel sounds normal in frequency in pitch, no  bruits, no rebound, no guarding, no midline pulsatile mass, no hepatomegaly, no splenomegaly EXT:  2 plus pulses throughout, no edema, no cyanosis no clubbing    EKG:  EKG is not ordered today.   Recent Labs: 01/15/2020: ALT 30; BUN 15; Creatinine, Ser 1.24; Hemoglobin 12.7; Platelets 258; Potassium 4.4; Sodium 140; TSH 4.860    Lipid Panel    Component Value Date/Time   CHOL 168 07/25/2019 1520   TRIG 156 (H) 07/25/2019 1520   TRIG 107 01/08/2014 1149   HDL 37 (L) 07/25/2019 1520   HDL 37 (L) 01/08/2014 1149   CHOLHDL 4.5 (H) 07/25/2019 1520   LDLCALC 103 (H) 07/25/2019 1520   LDLCALC 124 (H) 01/08/2014 1149      Wt Readings from Last 3 Encounters:  03/17/20 151 lb 3.2 oz (68.6 kg)  02/06/20 157 lb 3.2 oz (71.3 kg)  10/28/19 154 lb (69.9 kg)      Other studies Reviewed: Additional studies/ records that were reviewed  today include: Event monitor.  Echo. Review of the above records demonstrates:  Please see elsewhere in the note.     ASSESSMENT AND PLAN:  TACHYCARDIA:       I reviewed her rhythm strip again with her.  I cannot absolutely exclude flutter but I think this was sinus.  She is going to get a Chad.she is low risk for thromboembolism.  It was not particularly sustained event and she is not having any symptoms.  I would defer further management or reinitiating anticoagulation, which has been stopped, until he has recurrence.  SOB:   She is no longer having this.  No further work up.    Current medicines are reviewed at length with the patient today.  The patient does not have concerns regarding medicines.  The following changes have been made:  None  Labs/ tests ordered today include: None  No orders of the defined types were placed in this encounter.    Disposition:   FU me as needed.     Signed, Minus Breeding, MD  03/17/2020 5:00 PM    [place]

## 2020-03-17 ENCOUNTER — Encounter: Payer: Self-pay | Admitting: Cardiology

## 2020-03-17 ENCOUNTER — Ambulatory Visit: Payer: BC Managed Care – PPO | Admitting: Cardiology

## 2020-03-17 ENCOUNTER — Other Ambulatory Visit: Payer: Self-pay

## 2020-03-17 VITALS — BP 140/85 | HR 99 | Ht 67.0 in | Wt 151.2 lb

## 2020-03-17 DIAGNOSIS — R000 Tachycardia, unspecified: Secondary | ICD-10-CM | POA: Diagnosis not present

## 2020-03-17 DIAGNOSIS — R0602 Shortness of breath: Secondary | ICD-10-CM

## 2020-03-17 NOTE — Telephone Encounter (Signed)
° ° °  Pt said she spoke with [employer] and they said they haven't receive paperwork, she gave information    Phone: [phone] ext [place] Fax: [phone]

## 2020-03-17 NOTE — Patient Instructions (Signed)
Medication Instructions:  No changes *If you need a refill on your cardiac medications before your next appointment, please call your pharmacy*  Lab Work: None ordered this visit  Testing/Procedures: None ordered this visit  Follow-Up: At [facility], you and your health needs are our priority.  As part of our continuing mission to provide you with exceptional heart care, we have created designated Provider Care Teams.  These Care Teams include your primary Cardiologist (physician) and Advanced Practice Providers (APPs -  Physician Assistants and Nurse Practitioners) who all work together to provide you with the care you need, when you need it.    Your next appointment:   Follow up as needed

## 2020-03-18 NOTE — Telephone Encounter (Signed)
Forms have been refaxed on 12/15 to [employer]

## 2020-03-22 DIAGNOSIS — H04123 Dry eye syndrome of bilateral lacrimal glands: Secondary | ICD-10-CM | POA: Diagnosis not present

## 2020-05-25 DIAGNOSIS — Z1211 Encounter for screening for malignant neoplasm of colon: Secondary | ICD-10-CM | POA: Diagnosis not present

## 2020-06-01 LAB — EXTERNAL GENERIC LAB PROCEDURE: COLOGUARD: NEGATIVE

## 2020-06-01 LAB — COLOGUARD: COLOGUARD: NEGATIVE

## 2020-06-03 ENCOUNTER — Telehealth: Payer: Self-pay

## 2020-06-03 NOTE — Telephone Encounter (Signed)
Candace Torres, have you seen any Cologuard results come in for this patient?  There is nothing scanned into her chart.

## 2020-06-03 NOTE — Telephone Encounter (Signed)
I have not seen any results 

## 2020-06-03 NOTE — Telephone Encounter (Signed)
Pt got an email from cologuard that her results were sent to dr and to call for results. Pt will be at work and asked for a vm if no answer.

## 2020-06-03 NOTE — Telephone Encounter (Signed)
Left patient a message explaining that Candace Torres had not seen her results but I did go on the [employer] and print her results, give them to [place], and she would review and we would call her with those results.

## 2020-06-11 LAB — COLOGUARD: Cologuard: NEGATIVE

## 2020-08-20 ENCOUNTER — Telehealth: Payer: Self-pay | Admitting: Family

## 2020-08-20 NOTE — Telephone Encounter (Signed)
Patient's last office visit was in October of 2021.  Contacted patient and advised she would need to be seen to discuss this.  We scheduled her an appointment with Candace Torres on 08/26/20 at 9:25 am.  Asked her to bring labs with her.

## 2020-08-26 ENCOUNTER — Other Ambulatory Visit: Payer: Self-pay

## 2020-08-26 ENCOUNTER — Ambulatory Visit: Payer: BC Managed Care – PPO | Admitting: Family

## 2020-08-26 ENCOUNTER — Encounter: Payer: Self-pay | Admitting: Family

## 2020-08-26 VITALS — BP 128/61 | HR 78 | Temp 97.9°F | Ht 67.0 in | Wt 155.0 lb

## 2020-08-26 DIAGNOSIS — F411 Generalized anxiety disorder: Secondary | ICD-10-CM | POA: Diagnosis not present

## 2020-08-26 DIAGNOSIS — N183 Chronic kidney disease, stage 3 unspecified: Secondary | ICD-10-CM

## 2020-08-26 DIAGNOSIS — F32A Depression, unspecified: Secondary | ICD-10-CM | POA: Diagnosis not present

## 2020-08-26 DIAGNOSIS — K219 Gastro-esophageal reflux disease without esophagitis: Secondary | ICD-10-CM | POA: Diagnosis not present

## 2020-08-26 DIAGNOSIS — E785 Hyperlipidemia, unspecified: Secondary | ICD-10-CM

## 2020-08-26 DIAGNOSIS — E039 Hypothyroidism, unspecified: Secondary | ICD-10-CM

## 2020-08-26 LAB — CMP14+EGFR
ALT: 36 IU/L — ABNORMAL HIGH (ref 0–32)
AST: 36 IU/L (ref 0–40)
Albumin/Globulin Ratio: 1.8 (ref 1.2–2.2)
Albumin: 4.6 g/dL (ref 3.8–4.8)
Alkaline Phosphatase: 81 IU/L (ref 44–121)
BUN/Creatinine Ratio: 10 — ABNORMAL LOW (ref 12–28)
BUN: 13 mg/dL (ref 8–27)
Bilirubin Total: 0.4 mg/dL (ref 0.0–1.2)
CO2: 23 mmol/L (ref 20–29)
Calcium: 9.8 mg/dL (ref 8.7–10.3)
Chloride: 104 mmol/L (ref 96–106)
Creatinine, Ser: 1.36 mg/dL — ABNORMAL HIGH (ref 0.57–1.00)
Globulin, Total: 2.5 g/dL (ref 1.5–4.5)
Glucose: 108 mg/dL — ABNORMAL HIGH (ref 65–99)
Potassium: 4.7 mmol/L (ref 3.5–5.2)
Sodium: 140 mmol/L (ref 134–144)
Total Protein: 7.1 g/dL (ref 6.0–8.5)
eGFR: 43 mL/min/{1.73_m2} — ABNORMAL LOW (ref >59)

## 2020-08-26 NOTE — Progress Notes (Signed)
Subjective:    Patient ID: Candace Torres, female    DOB: [birth year], 65 y.o.   MRN: [id number]  Chief Complaint  Patient presents with  . Medical Management of Chronic Issues     Discuss Labs from work    Pt presents to the office today for chronic follow up. She gets lab work drawn at her work ever 3-6 months. She brought a copy today. We will scan into her chart. Her ASCVD risk is 6.7 %. She was told she needed to follow up to discuss cholesterol medications.   She also had an elevation of her creatinine to 1.42 and GFR of 38. Gastroesophageal Reflux She complains of belching and heartburn. She reports no hoarse voice. This is a chronic problem. The current episode started more than 1 year ago. The problem occurs rarely. The problem has been waxing and waning. Pertinent negatives include no fatigue. Risk factors include obesity. She has tried a PPI for the symptoms. The treatment provided moderate relief.  Thyroid Problem Presents for follow-up visit. Symptoms include dry skin. Patient reports no anxiety, constipation, depressed mood, fatigue or hoarse voice. The symptoms have been stable. Her past medical history is significant for hyperlipidemia.  Anxiety Presents for follow-up visit. Patient reports no depressed mood, excessive worry, hyperventilation, nervous/anxious behavior or restlessness. Symptoms occur rarely.    Depression        This is a chronic problem.  The current episode started more than 1 year ago.   The problem occurs intermittently.  Associated symptoms include no fatigue, not irritable, no restlessness and not sad.  Past treatments include SSRIs - Selective serotonin reuptake inhibitors.  Past medical history includes thyroid problem and anxiety.   Hyperlipidemia This is a chronic problem. The current episode started more than 1 year ago. She is currently on no antihyperlipidemic treatment. The current treatment provides no improvement of lipids.      Review of  Systems  Constitutional: Negative for fatigue.  HENT: Negative for hoarse voice.   Gastrointestinal: Positive for heartburn. Negative for constipation.  Psychiatric/Behavioral: Positive for depression. The patient is not nervous/anxious.   All other systems reviewed and are negative.      Objective:   Physical Exam Vitals reviewed.  Constitutional:      General: She is not irritable.She is not in acute distress.    Appearance: She is well-developed.  HENT:     Head: Normocephalic and atraumatic.     Right Ear: Tympanic membrane normal.     Left Ear: Tympanic membrane normal.  Eyes:     Pupils: Pupils are equal, round, and reactive to light.  Neck:     Thyroid: No thyromegaly.  Cardiovascular:     Rate and Rhythm: Normal rate and regular rhythm.     Heart sounds: Normal heart sounds. No murmur heard.   Pulmonary:     Effort: Pulmonary effort is normal. No respiratory distress.     Breath sounds: Normal breath sounds. No wheezing.  Abdominal:     General: Bowel sounds are normal. There is no distension.     Palpations: Abdomen is soft.     Tenderness: There is no abdominal tenderness.  Musculoskeletal:        General: No tenderness. Normal range of motion.     Cervical back: Normal range of motion and neck supple.  Skin:    General: Skin is warm and dry.  Neurological:     Mental Status: She is alert and oriented to  person, place, and time.     Cranial Nerves: No cranial nerve deficit.     Deep Tendon Reflexes: Reflexes are normal and symmetric.  Psychiatric:        Behavior: Behavior normal.        Thought Content: Thought content normal.        Judgment: Judgment normal.       BP 128/61   Pulse 78   Temp 97.9 F (36.6 C) (Temporal)   Ht _0  (1.702 m)   Wt 155 lb (70.3 kg)   BMI 24.28 kg/m      Assessment & Plan:  HAYDEN MABIN comes in today with chief complaint of Medical Management of Chronic Issues ( Discuss Labs from work )   Diagnosis and  orders addressed:  1. Gastroesophageal reflux disease, unspecified whether esophagitis present - CMP14+EGFR  2. Hypothyroidism, unspecified type - CMP14+EGFR  3. GAD (generalized anxiety disorder) - CMP14+EGFR  4. Depression, unspecified depression type - CMP14+EGFR  5. Hyperlipidemia, unspecified hyperlipidemia type - CMP14+EGFR  6. Stage 3 chronic kidney disease, unspecified whether stage 3a or 3b CKD ([place]) - CMP14+EGFR -If elevated will do referral to nephrologists  Avoid NSAID"S   Labs pending Health Maintenance reviewed Diet and exercise encouraged  Follow up plan: 6 months    Evelina Dun, FNP

## 2020-08-26 NOTE — Patient Instructions (Signed)
Chronic Kidney Disease, Adult Chronic kidney disease (CKD) occurs when the kidneys are slowly and permanently damaged over a long period of time. The kidneys are a pair of organs that do many important jobs in the body, including:  Removing waste and extra fluid from the blood to make urine.  Making hormones that maintain the amount of fluid in tissues and blood vessels.  Maintaining the right amount of fluids and chemicals in the body. A small amount of kidney damage may not cause problems, but a large amount of damage may make it hard or impossible for the kidneys to work right. Steps must be taken to slow kidney damage or to stop it from getting worse. If steps are not taken, the kidneys may stop working permanently (end-stage renal disease, or ESRD). Most of the time, CKD does not go away, but it can often be controlled. People who have CKD are usually able to live full lives. What are the causes? The most common causes of this condition are diabetes and high blood pressure (hypertension). Other causes include:  Cardiovascular diseases. These affect the heart and blood vessels.  Kidney diseases. These include: ? Glomerulonephritis, or inflammation of the tiny filters in the kidneys. ? Interstitial nephritis. This is swelling of the small tubes of the kidneys and of the surrounding structures. ? Polycystic kidney disease, in which clusters of fluid-filled sacs form within the kidneys. ? Renal vascular disease. This includes disorders that affect the arteries and veins of the kidneys.  Diseases that affect the body's defense system (immune system).  A problem with urine flow. This may be caused by: ? Kidney stones. ? Cancer. ? An enlarged prostate, in males.  A kidney infection or urinary tract infection (UTI) that keeps coming back.  Vasculitis. This is swelling or inflammation of the blood vessels. What increases the risk? Your chances of having kidney disease increase with  age. The following factors may make you more likely to develop this condition:  A family history of kidney disease or kidney failure. Kidney failure means the kidneys can no longer work right.  Certain genetic diseases.  Taking medicines often that are damaging to the kidneys.  Being around or being in contact with toxic substances.  Obesity.  A history of tobacco use. What are the signs or symptoms? Symptoms of this condition include:  Feeling very tired (lethargic) and having less energy.  Swelling, or edema, of the face, legs, ankles, or feet.  Nausea or vomiting, or loss of appetite.  Confusion or trouble concentrating.  Muscle twitches and cramps, especially in the legs.  Dry, itchy skin.  A metallic taste in the mouth.  Producing less urine, or producing more urine (especially at night).  Shortness of breath.  Trouble sleeping. CKD may also result in not having enough red blood cells or hemoglobin in the blood (anemia) or having weak bones (bone disease). Symptoms develop slowly and may not be obvious until the kidney damage becomes severe. It is possible to have kidney disease for years without having symptoms. How is this diagnosed? This condition may be diagnosed based on:  Blood tests.  Urine tests.  Imaging tests, such as an ultrasound or a CT scan.  A kidney biopsy. This involves removing a sample of kidney tissue to be looked at under a microscope. Results from these tests will help to determine how serious the CKD is. How is this treated? There is no cure for most cases of this condition, but treatment usually relieves   symptoms and prevents or slows the worsening of the disease. Treatment may include:  Diet changes, which may require you to avoid alcohol and foods that are high in salt, potassium, phosphorous, and protein.  Medicines. These may: ? Lower blood pressure. ? Control blood sugar (glucose). ? Relieve anemia. ? Relieve  swelling. ? Protect your bones. ? Improve the balance of salts and minerals in your blood (electrolytes).  Dialysis, which is a type of treatment that removes toxic waste from the body. It may be needed if you have kidney failure.  Managing any other conditions that are causing your CKD or making it worse. Follow these instructions at home: Medicines  Take over-the-counter and prescription medicines only as told by your health care provider. The amount of some medicines that you take may need to be changed.  Do not take any new medicines unless approved by your health care provider. Many medicines can make kidney damage worse.  Do not take any vitamin and mineral supplements unless approved by your health care provider. Many nutritional supplements can make kidney damage worse. Lifestyle  Do not use any products that contain nicotine or tobacco, such as cigarettes, e-cigarettes, and chewing tobacco. If you need help quitting, ask your health care provider.  If you drink alcohol: ? Limit how much you use to:  0-1 drink a day for women who are not pregnant.  0-2 drinks a day for men. ? Know how much alcohol is in your drink. In the [place] one drink equals one 12 oz bottle of beer (355 mL), one 5 oz glass of wine (148 mL), or one 1 oz glass of hard liquor (44 mL).  Maintain a healthy weight. If you need help, ask your health care provider.   General instructions  Follow instructions from your health care provider about eating or drinking restrictions, including any prescribed diet.  Track your blood pressure at home. Report changes in your blood pressure as told.  If you are being treated for diabetes, track your blood glucose levels as told.  Start or continue an exercise plan. Exercise at least 30 minutes a day, 5 days a week.  Keep your immunizations up to date as told.  Keep all follow-up visits. This is important.   Where to find more information  American Association of  Kidney Patients: www.aakp.org  National Kidney Foundation: www.kidney.org  American Kidney Fund: www.akfinc.org  Life Options: www.lifeoptions.org  Kidney School: www.kidneyschool.org Contact a health care provider if:  Your symptoms get worse.  You develop new symptoms. Get help right away if:  You develop symptoms of ESRD. These include: ? Headaches. ? Numbness in your hands or feet. ? Easy bruising. ? Frequent hiccups. ? Chest pain. ? Shortness of breath. ? Lack of menstrual periods, in women.  You have a fever.  You are producing less urine than usual.  You have pain or bleeding when you urinate or when you have a bowel movement. These symptoms may represent a serious problem that is an emergency. Do not wait to see if the symptoms will go away. Get medical help right away. Call your local emergency services (911 in the [place] Do not drive yourself to the hospital. Summary  Chronic kidney disease (CKD) occurs when the kidneys become damaged slowly over a long period of time.  The most common causes of this condition are diabetes and high blood pressure (hypertension).  There is no cure for most cases of CKD, but treatment usually relieves symptoms and prevents   or slows the worsening of the disease. Treatment may include a combination of lifestyle changes, medicines, and dialysis. This information is not intended to replace advice given to you by your health care provider. Make sure you discuss any questions you have with your health care provider. Document Revised: 06/25/2019 Document Reviewed: 06/25/2019 Elsevier Patient Education  2021 [place] and Cholesterol Restricted Eating Plan Eating a diet that limits fat and cholesterol may help lower your risk for heart disease and other conditions. Your body needs fat and cholesterol for basic functions, but eating too much of these things can be harmful to your health. Your health care provider may order lab tests  to check your blood fat (lipid) and cholesterol levels. This helps your health care provider understand your risk for certain conditions and whether you need to make diet changes. Work with your health care provider or dietitian to make an eating plan that is right for you. Your plan includes:  Limit your fat intake to ______% or less of your total calories a day.  Limit your saturated fat intake to ______% or less of your total calories a day.  Limit the amount of cholesterol in your diet to less than _________mg a day.  Eat ___________ g of fiber a day. What are tips for following this plan? General guidelines  If you are overweight, work with your health care provider to lose weight safely. Losing just 5-10% of your body weight can improve your overall health and help prevent diseases such as diabetes and heart disease.  Avoid: ? Foods with added sugar. ? Fried foods. ? Foods that contain partially hydrogenated oils, including stick margarine, some tub margarines, cookies, crackers, and other baked goods.  Limit alcohol intake to no more than 1 drink a day for nonpregnant women and 2 drinks a day for men. One drink equals 12 oz of beer, 5 oz of wine, or 1 oz of hard liquor.   Reading food labels  Check food labels for: ? Trans fats, partially hydrogenated oils, or high amounts of saturated fat. Avoid foods that contain saturated fat and trans fat. ? The amount of cholesterol in each serving. Try to eat no more than 200 mg of cholesterol each day. ? The amount of fiber in each serving. Try to eat at least 20-30 g of fiber each day.  Choose foods with healthy fats, such as: ? Monounsaturated and polyunsaturated fats. These include olive and canola oil, flaxseeds, walnuts, almonds, and seeds. ? Omega-3 fats. These are found in foods such as salmon, mackerel, sardines, tuna, flaxseed oil, and ground flaxseeds.  Choose grain products that have whole grains. Look for the word "whole" as  the first word in the ingredient list. Cooking  Cook foods using methods other than frying. Baking, boiling, grilling, and broiling are some healthy options.  Eat more home-cooked food and less restaurant, buffet, and fast food.  Avoid cooking using saturated fats. ? Animal sources of saturated fats include meats, butter, and cream. ? Plant sources of saturated fats include palm oil, palm kernel oil, and coconut oil. Meal planning  At meals, imagine dividing your plate into fourths: ? Fill one-half of your plate with vegetables and green salads. ? Fill one-fourth of your plate with whole grains. ? Fill one-fourth of your plate with lean protein foods.  Eat fish that is high in omega-3 fats at least two times a week.  Eat more foods that contain fiber, such as whole grains, beans, apples,  broccoli, carrots, peas, and barley. These foods help promote healthy cholesterol levels in the blood.   Recommended foods Grains  Whole grains, such as whole wheat or whole grain breads, crackers, cereals, and pasta. Unsweetened oatmeal, bulgur, barley, quinoa, or brown rice. Corn or whole wheat flour tortillas. Vegetables  Fresh or frozen vegetables (raw, steamed, roasted, or grilled). Green salads. Fruits  All fresh, canned (in natural juice), or frozen fruits. Meats and other protein foods  Ground beef (85% or leaner), grass-fed beef, or beef trimmed of fat. Skinless chicken or [place]. Ground chicken or [place]. Pork trimmed of fat. All fish and seafood. Egg whites. Dried beans, peas, or lentils. Unsalted nuts or seeds. Unsalted canned beans. Natural nut butters without added sugar and oil. Dairy  Low-fat or nonfat dairy products, such as skim or 1% milk, 2% or reduced-fat cheeses, low-fat and fat-free ricotta or cottage cheese, or plain low-fat and nonfat yogurt. Fats and oils  Tub margarine without trans fats. Light or reduced-fat mayonnaise and salad dressings. Avocado. Olive, canola,  sesame, or safflower oils. The items listed above may not be a complete list of foods and beverages you can eat. Contact a dietitian for more information. Foods to avoid Grains  White bread. White pasta. White rice. Cornbread. Bagels, pastries, and croissants. Crackers and snack foods that contain trans fat and hydrogenated oils. Vegetables  Vegetables cooked in cheese, cream, or butter sauce. Fried vegetables. Fruits  Canned fruit in heavy syrup. Fruit in cream or butter sauce. Fried fruit. Meats and other protein foods  Fatty cuts of meat. Ribs, chicken wings, bacon, sausage, bologna, salami, chitterlings, fatback, hot dogs, bratwurst, and packaged lunch meats. Liver and organ meats. Whole eggs and egg yolks. Chicken and [place] with skin. Fried meat. Dairy  Whole or 2% milk, cream, half-and-half, and cream cheese. Whole milk cheeses. Whole-fat or sweetened yogurt. Full-fat cheeses. Nondairy creamers and whipped toppings. Processed cheese, cheese spreads, and cheese curds. Beverages  Alcohol. Sugar-sweetened drinks such as sodas, lemonade, and fruit drinks. Fats and oils  Butter, stick margarine, lard, shortening, ghee, or bacon fat. Coconut, palm kernel, and palm oils. Sweets and desserts  Corn syrup, sugars, honey, and molasses. Candy. Jam and jelly. Syrup. Sweetened cereals. Cookies, pies, cakes, donuts, muffins, and ice cream. The items listed above may not be a complete list of foods and beverages you should avoid. Contact a dietitian for more information. Summary  Your body needs fat and cholesterol for basic functions. However, eating too much of these things can be harmful to your health.  Work with your health care provider and dietitian to follow a diet low in fat and cholesterol. Doing this may help lower your risk for heart disease and other conditions.  Choose healthy fats, such as monounsaturated and polyunsaturated fats, and foods high in omega-3 fatty acids.  Eat  fiber-rich foods, such as whole grains, beans, peas, fruits, and vegetables.  Limit or avoid alcohol, fried foods, and foods high in saturated fats, partially hydrogenated oils, and sugar. This information is not intended to replace advice given to you by your health care provider. Make sure you discuss any questions you have with your health care provider. Document Revised: 11/19/2019 Document Reviewed: 07/23/2019 Elsevier Patient Education  2021 [employer].

## 2020-08-27 ENCOUNTER — Other Ambulatory Visit: Payer: Self-pay | Admitting: Family

## 2020-08-27 DIAGNOSIS — N183 Chronic kidney disease, stage 3 unspecified: Secondary | ICD-10-CM

## 2020-09-16 DIAGNOSIS — H10013 Acute follicular conjunctivitis, bilateral: Secondary | ICD-10-CM | POA: Diagnosis not present

## 2020-10-07 DIAGNOSIS — I5032 Chronic diastolic (congestive) heart failure: Secondary | ICD-10-CM | POA: Diagnosis not present

## 2020-10-07 DIAGNOSIS — Z79899 Other long term (current) drug therapy: Secondary | ICD-10-CM | POA: Diagnosis not present

## 2020-10-07 DIAGNOSIS — D3502 Benign neoplasm of left adrenal gland: Secondary | ICD-10-CM | POA: Diagnosis not present

## 2020-10-07 DIAGNOSIS — N1831 Chronic kidney disease, stage 3a: Secondary | ICD-10-CM | POA: Diagnosis not present

## 2020-10-12 ENCOUNTER — Other Ambulatory Visit (HOSPITAL_COMMUNITY): Payer: Self-pay | Admitting: Nephrology

## 2020-10-12 DIAGNOSIS — I5032 Chronic diastolic (congestive) heart failure: Secondary | ICD-10-CM

## 2020-10-12 DIAGNOSIS — I129 Hypertensive chronic kidney disease with stage 1 through stage 4 chronic kidney disease, or unspecified chronic kidney disease: Secondary | ICD-10-CM | POA: Diagnosis not present

## 2020-10-12 DIAGNOSIS — N1831 Chronic kidney disease, stage 3a: Secondary | ICD-10-CM

## 2020-10-12 DIAGNOSIS — Z79899 Other long term (current) drug therapy: Secondary | ICD-10-CM | POA: Diagnosis not present

## 2020-10-12 DIAGNOSIS — Z1159 Encounter for screening for other viral diseases: Secondary | ICD-10-CM | POA: Diagnosis not present

## 2020-10-12 DIAGNOSIS — D3502 Benign neoplasm of left adrenal gland: Secondary | ICD-10-CM | POA: Diagnosis not present

## 2020-10-15 ENCOUNTER — Other Ambulatory Visit (HOSPITAL_COMMUNITY): Payer: Self-pay | Admitting: Nephrology

## 2020-10-15 DIAGNOSIS — I5032 Chronic diastolic (congestive) heart failure: Secondary | ICD-10-CM

## 2020-10-15 DIAGNOSIS — N1831 Chronic kidney disease, stage 3a: Secondary | ICD-10-CM

## 2020-10-19 ENCOUNTER — Ambulatory Visit (HOSPITAL_COMMUNITY): Payer: BC Managed Care – PPO

## 2020-10-25 ENCOUNTER — Other Ambulatory Visit: Payer: Self-pay

## 2020-10-25 ENCOUNTER — Ambulatory Visit (HOSPITAL_COMMUNITY)
Admission: RE | Admit: 2020-10-25 | Discharge: 2020-10-25 | Disposition: A | Discharge status: Home / Self Care | Payer: BC Managed Care – PPO | Source: Ambulatory Visit | Attending: Nephrology | Admitting: Nephrology

## 2020-10-25 DIAGNOSIS — N1831 Chronic kidney disease, stage 3a: Secondary | ICD-10-CM | POA: Diagnosis not present

## 2020-10-25 DIAGNOSIS — I5032 Chronic diastolic (congestive) heart failure: Secondary | ICD-10-CM

## 2020-10-25 DIAGNOSIS — K760 Fatty (change of) liver, not elsewhere classified: Secondary | ICD-10-CM | POA: Diagnosis not present

## 2020-10-29 ENCOUNTER — Other Ambulatory Visit: Payer: Self-pay

## 2020-10-29 ENCOUNTER — Ambulatory Visit (HOSPITAL_COMMUNITY)
Admission: RE | Admit: 2020-10-29 | Discharge: 2020-10-29 | Disposition: A | Discharge status: Home / Self Care | Payer: BC Managed Care – PPO | Source: Ambulatory Visit | Attending: Nephrology | Admitting: Nephrology

## 2020-10-29 DIAGNOSIS — K760 Fatty (change of) liver, not elsewhere classified: Secondary | ICD-10-CM | POA: Diagnosis not present

## 2020-10-29 DIAGNOSIS — N1831 Chronic kidney disease, stage 3a: Secondary | ICD-10-CM | POA: Diagnosis not present

## 2020-10-29 DIAGNOSIS — R160 Hepatomegaly, not elsewhere classified: Secondary | ICD-10-CM | POA: Diagnosis not present

## 2020-10-29 DIAGNOSIS — Z9049 Acquired absence of other specified parts of digestive tract: Secondary | ICD-10-CM | POA: Diagnosis not present

## 2020-10-29 DIAGNOSIS — I5032 Chronic diastolic (congestive) heart failure: Secondary | ICD-10-CM | POA: Diagnosis not present

## 2020-10-29 DIAGNOSIS — E279 Disorder of adrenal gland, unspecified: Secondary | ICD-10-CM | POA: Diagnosis not present

## 2020-10-29 MED ORDER — GADOBUTROL 1 MMOL/ML IV SOLN
7.0000 mL | Freq: Once | INTRAVENOUS | Status: AC | PRN
  Administered 2020-10-29: 7 mL via INTRAVENOUS

## 2020-11-04 DIAGNOSIS — D3502 Benign neoplasm of left adrenal gland: Secondary | ICD-10-CM | POA: Diagnosis not present

## 2020-11-04 DIAGNOSIS — N1831 Chronic kidney disease, stage 3a: Secondary | ICD-10-CM | POA: Diagnosis not present

## 2020-11-04 DIAGNOSIS — R7303 Prediabetes: Secondary | ICD-10-CM | POA: Diagnosis not present

## 2020-11-04 DIAGNOSIS — I5032 Chronic diastolic (congestive) heart failure: Secondary | ICD-10-CM | POA: Diagnosis not present

## 2020-11-05 ENCOUNTER — Telehealth: Payer: Self-pay | Admitting: Family

## 2020-11-05 NOTE — Telephone Encounter (Signed)
Patient aware.

## 2020-11-05 NOTE — Telephone Encounter (Signed)
Patient seen Dr. Theador Hawthorne on 11/04/2020. Patient whould like for Christy to look at her visit and any results. And she would like her thoughts on it all. Please advise. Office notes in care everywhere.

## 2020-11-05 NOTE — Telephone Encounter (Signed)
Kidneys are stable. Avoid NSAIDS, low carb diet, encourage weight loss.

## 2020-11-16 ENCOUNTER — Telehealth: Payer: Self-pay | Admitting: Family

## 2020-11-16 NOTE — Telephone Encounter (Signed)
Fyi.

## 2020-11-25 DIAGNOSIS — Z1231 Encounter for screening mammogram for malignant neoplasm of breast: Secondary | ICD-10-CM | POA: Diagnosis not present

## 2020-12-07 DIAGNOSIS — H40033 Anatomical narrow angle, bilateral: Secondary | ICD-10-CM | POA: Diagnosis not present

## 2020-12-07 DIAGNOSIS — H04123 Dry eye syndrome of bilateral lacrimal glands: Secondary | ICD-10-CM | POA: Diagnosis not present

## 2021-02-04 DIAGNOSIS — N1831 Chronic kidney disease, stage 3a: Secondary | ICD-10-CM | POA: Diagnosis not present

## 2021-02-04 DIAGNOSIS — D3502 Benign neoplasm of left adrenal gland: Secondary | ICD-10-CM | POA: Diagnosis not present

## 2021-02-04 DIAGNOSIS — I5032 Chronic diastolic (congestive) heart failure: Secondary | ICD-10-CM | POA: Diagnosis not present

## 2021-02-04 DIAGNOSIS — R7303 Prediabetes: Secondary | ICD-10-CM | POA: Diagnosis not present

## 2021-02-10 ENCOUNTER — Other Ambulatory Visit: Payer: Self-pay | Admitting: Nephrology

## 2021-02-10 ENCOUNTER — Other Ambulatory Visit (HOSPITAL_COMMUNITY): Payer: Self-pay | Admitting: Nephrology

## 2021-02-10 DIAGNOSIS — N1831 Chronic kidney disease, stage 3a: Secondary | ICD-10-CM

## 2021-02-10 DIAGNOSIS — R7303 Prediabetes: Secondary | ICD-10-CM

## 2021-02-10 DIAGNOSIS — D3502 Benign neoplasm of left adrenal gland: Secondary | ICD-10-CM

## 2021-02-25 ENCOUNTER — Other Ambulatory Visit: Payer: Self-pay

## 2021-02-25 ENCOUNTER — Ambulatory Visit (HOSPITAL_COMMUNITY)
Admission: RE | Admit: 2021-02-25 | Discharge: 2021-02-25 | Disposition: A | Discharge status: Home / Self Care | Payer: BC Managed Care – PPO | Source: Ambulatory Visit | Attending: Nephrology | Admitting: Nephrology

## 2021-02-25 DIAGNOSIS — Z9049 Acquired absence of other specified parts of digestive tract: Secondary | ICD-10-CM | POA: Diagnosis not present

## 2021-02-25 DIAGNOSIS — N1831 Chronic kidney disease, stage 3a: Secondary | ICD-10-CM | POA: Insufficient documentation

## 2021-02-25 DIAGNOSIS — D3502 Benign neoplasm of left adrenal gland: Secondary | ICD-10-CM | POA: Insufficient documentation

## 2021-02-25 DIAGNOSIS — R7303 Prediabetes: Secondary | ICD-10-CM | POA: Insufficient documentation

## 2021-02-25 DIAGNOSIS — D3500 Benign neoplasm of unspecified adrenal gland: Secondary | ICD-10-CM | POA: Diagnosis not present

## 2021-02-25 MED ORDER — GADOBUTROL 1 MMOL/ML IV SOLN
7.0000 mL | Freq: Once | INTRAVENOUS | Status: AC | PRN
  Administered 2021-02-25: 7 mL via INTRAVENOUS

## 2021-03-01 DIAGNOSIS — H10013 Acute follicular conjunctivitis, bilateral: Secondary | ICD-10-CM | POA: Diagnosis not present

## 2021-03-01 DIAGNOSIS — N1831 Chronic kidney disease, stage 3a: Secondary | ICD-10-CM | POA: Diagnosis not present

## 2021-03-01 DIAGNOSIS — I5032 Chronic diastolic (congestive) heart failure: Secondary | ICD-10-CM | POA: Diagnosis not present

## 2021-03-01 DIAGNOSIS — D3502 Benign neoplasm of left adrenal gland: Secondary | ICD-10-CM | POA: Diagnosis not present

## 2021-03-01 DIAGNOSIS — R7303 Prediabetes: Secondary | ICD-10-CM | POA: Diagnosis not present

## 2021-03-22 ENCOUNTER — Ambulatory Visit (INDEPENDENT_AMBULATORY_CARE_PROVIDER_SITE_OTHER): Payer: BC Managed Care – PPO | Admitting: Family

## 2021-03-22 ENCOUNTER — Encounter: Payer: Self-pay | Admitting: Family

## 2021-03-22 VITALS — BP 121/77 | HR 88 | Temp 97.8°F | Ht 67.0 in | Wt 150.4 lb

## 2021-03-22 DIAGNOSIS — Z23 Encounter for immunization: Secondary | ICD-10-CM | POA: Diagnosis not present

## 2021-03-22 DIAGNOSIS — E039 Hypothyroidism, unspecified: Secondary | ICD-10-CM

## 2021-03-22 DIAGNOSIS — F411 Generalized anxiety disorder: Secondary | ICD-10-CM

## 2021-03-22 DIAGNOSIS — Z0001 Encounter for general adult medical examination with abnormal findings: Secondary | ICD-10-CM | POA: Diagnosis not present

## 2021-03-22 DIAGNOSIS — N183 Chronic kidney disease, stage 3 unspecified: Secondary | ICD-10-CM

## 2021-03-22 DIAGNOSIS — Z0000 Encounter for general adult medical examination without abnormal findings: Secondary | ICD-10-CM

## 2021-03-22 DIAGNOSIS — K219 Gastro-esophageal reflux disease without esophagitis: Secondary | ICD-10-CM | POA: Diagnosis not present

## 2021-03-22 DIAGNOSIS — F32A Depression, unspecified: Secondary | ICD-10-CM

## 2021-03-22 LAB — URINALYSIS, COMPLETE
Bilirubin, UA: NEGATIVE
Glucose, UA: NEGATIVE
Nitrite, UA: NEGATIVE
Protein,UA: NEGATIVE
Specific Gravity, UA: 1.02 (ref 1.005–1.030)
Urobilinogen, Ur: 0.2 mg/dL (ref 0.2–1.0)
pH, UA: 6 (ref 5.0–7.5)

## 2021-03-22 LAB — MICROSCOPIC EXAMINATION
Bacteria, UA: NONE SEEN
Epithelial Cells (non renal): NONE SEEN /hpf (ref 0–10)
RBC, Urine: NONE SEEN /hpf (ref 0–2)
Renal Epithel, UA: NONE SEEN /hpf
WBC, UA: NONE SEEN /hpf (ref 0–5)

## 2021-03-22 NOTE — Patient Instructions (Signed)
Health Maintenance After Age 67 After age 67, you are at a higher risk for certain long-term diseases and infections as well as injuries from falls. Falls are a major cause of broken bones and head injuries in people who are older than age 67. Getting regular preventive care can help to keep you healthy and well. Preventive care includes getting regular testing and making lifestyle changes as recommended by your health care provider. Talk with your health care provider about: Which screenings and tests you should have. A screening is a test that checks for a disease when you have no symptoms. A diet and exercise plan that is right for you. What should I know about screenings and tests to prevent falls? Screening and testing are the best ways to find a health problem early. Early diagnosis and treatment give you the best chance of managing medical conditions that are common after age 67. Certain conditions and lifestyle choices may make you more likely to have a fall. Your health care provider may recommend: Regular vision checks. Poor vision and conditions such as cataracts can make you more likely to have a fall. If you wear glasses, make sure to get your prescription updated if your vision changes. Medicine review. Work with your health care provider to regularly review all of the medicines you are taking, including over-the-counter medicines. Ask your health care provider about any side effects that may make you more likely to have a fall. Tell your health care provider if any medicines that you take make you feel dizzy or sleepy. Strength and balance checks. Your health care provider may recommend certain tests to check your strength and balance while standing, walking, or changing positions. Foot health exam. Foot pain and numbness, as well as not wearing proper footwear, can make you more likely to have a fall. Screenings, including: Osteoporosis screening. Osteoporosis is a condition that causes  the bones to get weaker and break more easily. Blood pressure screening. Blood pressure changes and medicines to control blood pressure can make you feel dizzy. Depression screening. You may be more likely to have a fall if you have a fear of falling, feel depressed, or feel unable to do activities that you used to do. Alcohol use screening. Using too much alcohol can affect your balance and may make you more likely to have a fall. Follow these instructions at home: Lifestyle Do not drink alcohol if: Your health care provider tells you not to drink. If you drink alcohol: Limit how much you have to: 0-1 drink a day for women. 0-2 drinks a day for men. Know how much alcohol is in your drink. In the [place] one drink equals one 12 oz bottle of beer (355 mL), one 5 oz glass of wine (148 mL), or one 1 oz glass of hard liquor (44 mL). Do not use any products that contain nicotine or tobacco. These products include cigarettes, chewing tobacco, and vaping devices, such as e-cigarettes. If you need help quitting, ask your health care provider. Activity  Follow a regular exercise program to stay fit. This will help you maintain your balance. Ask your health care provider what types of exercise are appropriate for you. If you need a cane or walker, use it as recommended by your health care provider. Wear supportive shoes that have nonskid soles. Safety  Remove any tripping hazards, such as rugs, cords, and clutter. Install safety equipment such as grab bars in bathrooms and safety rails on stairs. Keep rooms and walkways   well-lit. General instructions Talk with your health care provider about your risks for falling. Tell your health care provider if: You fall. Be sure to tell your health care provider about all falls, even ones that seem minor. You feel dizzy, tiredness (fatigue), or off-balance. Take over-the-counter and prescription medicines only as told by your health care provider. These include  supplements. Eat a healthy diet and maintain a healthy weight. A healthy diet includes low-fat dairy products, low-fat (lean) meats, and fiber from whole grains, beans, and lots of fruits and vegetables. Stay current with your vaccines. Schedule regular health, dental, and eye exams. Summary Having a healthy lifestyle and getting preventive care can help to protect your health and wellness after age 67. Screening and testing are the best way to find a health problem early and help you avoid having a fall. Early diagnosis and treatment give you the best chance for managing medical conditions that are more common for people who are older than age 67. Falls are a major cause of broken bones and head injuries in people who are older than age 67. Take precautions to prevent a fall at home. Work with your health care provider to learn what changes you can make to improve your health and wellness and to prevent falls. This information is not intended to replace advice given to you by your health care provider. Make sure you discuss any questions you have with your health care provider. Document Revised: 08/09/2020 Document Reviewed: 08/09/2020 Elsevier Patient Education  2022 [employer]  

## 2021-03-22 NOTE — Progress Notes (Signed)
Subjective:    Patient ID: Candace Torres, female    DOB: 1955-12-07, 65 y.o.   MRN: [id number]  Chief Complaint  Patient presents with   Annual Exam   PT presents to the office today for CPE without pap. She is followed by Nephrologists for CKD.  Gastroesophageal Reflux She complains of belching and heartburn. This is a chronic problem. The current episode started more than 1 year ago. The problem occurs occasionally. The problem has been waxing and waning. Risk factors include obesity. She has tried a PPI for the symptoms.  Thyroid Problem Presents for follow-up visit. Symptoms include dry skin. Patient reports no anxiety, diaphoresis or palpitations. The symptoms have been stable.  Anxiety Presents for follow-up visit. Symptoms include excessive worry and irritability. Patient reports no nervous/anxious behavior, palpitations or restlessness. Symptoms occur occasionally.    Depression        This is a chronic problem.  The current episode started more than 1 year ago.   The onset quality is gradual.   The problem occurs intermittently.  Associated symptoms include no helplessness, no hopelessness, no restlessness and not sad.  Past treatments include SSRIs - Selective serotonin reuptake inhibitors.  Past medical history includes thyroid problem and anxiety.      Review of Systems  Constitutional:  Positive for irritability. Negative for diaphoresis.  Cardiovascular:  Negative for palpitations.  Gastrointestinal:  Positive for heartburn.  Psychiatric/Behavioral:  Positive for depression. The patient is not nervous/anxious.   All other systems reviewed and are negative.  Family History  Problem Relation Age of Onset   Depression Mother    Hypertension Mother    COPD Father    Colon cancer Neg Hx    Colon polyps Neg Hx    Social History   Socioeconomic History   Marital status: Married    Spouse name: Not on file   Number of children: Not on file   Years of  education: Not on file   Highest education level: Not on file  Occupational History   Not on file  Tobacco Use   Smoking status: Former   Smokeless tobacco: Never   Tobacco comments:    Quit 10 years.    Vaping Use   Vaping Use: Never used  Substance and Sexual Activity   Alcohol use: No   Drug use: No   Sexual activity: Not on file  Other Topics Concern   Not on file  Social History Narrative   Lives at home with husband.  One son and three grands.     Social Determinants of Health   Financial Resource Strain: Not on file  Food Insecurity: Not on file  Transportation Needs: Not on file  Physical Activity: Not on file  Stress: Not on file  Social Connections: Not on file       Objective:   Physical Exam Vitals reviewed.  Constitutional:      General: She is not in acute distress.    Appearance: She is well-developed.  HENT:     Head: Normocephalic and atraumatic.     Right Ear: Tympanic membrane normal.     Left Ear: Tympanic membrane normal.  Eyes:     Pupils: Pupils are equal, round, and reactive to light.  Neck:     Thyroid: No thyromegaly.  Cardiovascular:     Rate and Rhythm: Normal rate and regular rhythm.     Heart sounds: Normal heart sounds. No murmur heard. Pulmonary:  Effort: Pulmonary effort is normal. No respiratory distress.     Breath sounds: Normal breath sounds. No wheezing.  Abdominal:     General: Bowel sounds are normal. There is no distension.     Palpations: Abdomen is soft.     Tenderness: There is no abdominal tenderness.  Musculoskeletal:        General: No tenderness. Normal range of motion.     Cervical back: Normal range of motion and neck supple.  Skin:    General: Skin is warm and dry.  Neurological:     Mental Status: She is alert and oriented to person, place, and time.     Cranial Nerves: No cranial nerve deficit.     Deep Tendon Reflexes: Reflexes are normal and symmetric.  Psychiatric:        Behavior: Behavior  normal.        Thought Content: Thought content normal.        Judgment: Judgment normal.      BP 121/77    Pulse 88    Temp 97.8 F (36.6 C) (Temporal)    Ht 5' 7"  (1.702 m)    Wt 150 lb 6.4 oz (68.2 kg)    BMI 23.56 kg/m      Assessment & Plan:  Candace Torres comes in today with chief complaint of Annual Exam   Diagnosis and orders addressed:  1. Annual physical exam - Urinalysis, Complete - Pneumococcal conjugate vaccine 20-valent (Prevnar 20) - CMP14+EGFR - CBC with Differential/Platelet - Lipid panel - TSH  2. Gastroesophageal reflux disease, unspecified whether esophagitis present - CMP14+EGFR - CBC with Differential/Platelet  3. Hypothyroidism, unspecified type - CMP14+EGFR - CBC with Differential/Platelet - TSH  4. Stage 3 chronic kidney disease, unspecified whether stage 3a or 3b CKD (HCC) - CMP14+EGFR - CBC with Differential/Platelet  5. GAD (generalized anxiety disorder) - CMP14+EGFR - CBC with Differential/Platelet  6. Depression, unspecified depression type - CMP14+EGFR - CBC with Differential/Platelet   Labs pending Health Maintenance reviewed Diet and exercise encouraged  Follow up plan: 6 months    Evelina Dun, FNP

## 2021-03-23 LAB — CMP14+EGFR
ALT: 25 IU/L (ref 0–32)
AST: 26 IU/L (ref 0–40)
Albumin/Globulin Ratio: 1.7 (ref 1.2–2.2)
Albumin: 4.7 g/dL (ref 3.8–4.8)
Alkaline Phosphatase: 92 IU/L (ref 44–121)
BUN/Creatinine Ratio: 12 (ref 12–28)
BUN: 16 mg/dL (ref 8–27)
Bilirubin Total: 0.4 mg/dL (ref 0.0–1.2)
CO2: 22 mmol/L (ref 20–29)
Calcium: 10 mg/dL (ref 8.7–10.3)
Chloride: 103 mmol/L (ref 96–106)
Creatinine, Ser: 1.37 mg/dL — ABNORMAL HIGH (ref 0.57–1.00)
Globulin, Total: 2.7 g/dL (ref 1.5–4.5)
Glucose: 95 mg/dL (ref 70–99)
Potassium: 4.7 mmol/L (ref 3.5–5.2)
Sodium: 140 mmol/L (ref 134–144)
Total Protein: 7.4 g/dL (ref 6.0–8.5)
eGFR: 42 mL/min/{1.73_m2} — ABNORMAL LOW (ref >59)

## 2021-03-23 LAB — LIPID PANEL
Chol/HDL Ratio: 4.4 ratio (ref 0.0–4.4)
Cholesterol, Total: 177 mg/dL (ref 100–199)
HDL: 40 mg/dL (ref >39)
LDL Chol Calc (NIH): 114 mg/dL — ABNORMAL HIGH (ref 0–99)
Triglycerides: 128 mg/dL (ref 0–149)
VLDL Cholesterol Cal: 23 mg/dL (ref 5–40)

## 2021-03-23 LAB — CBC WITH DIFFERENTIAL/PLATELET
Basophils Absolute: 0.2 10*3/uL (ref 0.0–0.2)
Basos: 2 %
EOS (ABSOLUTE): 0.5 10*3/uL — ABNORMAL HIGH (ref 0.0–0.4)
Eos: 5 %
Hematocrit: 45 % (ref 34.0–46.6)
Hemoglobin: 15.1 g/dL (ref 11.1–15.9)
Immature Grans (Abs): 0 10*3/uL (ref 0.0–0.1)
Immature Granulocytes: 0 %
Lymphocytes Absolute: 2.9 10*3/uL (ref 0.7–3.1)
Lymphs: 29 %
MCH: 30 pg (ref 26.6–33.0)
MCHC: 33.6 g/dL (ref 31.5–35.7)
MCV: 90 fL (ref 79–97)
Monocytes Absolute: 0.8 10*3/uL (ref 0.1–0.9)
Monocytes: 8 %
Neutrophils Absolute: 5.6 10*3/uL (ref 1.4–7.0)
Neutrophils: 56 %
Platelets: 259 10*3/uL (ref 150–450)
RBC: 5.03 x10E6/uL (ref 3.77–5.28)
RDW: 12.8 % (ref 11.7–15.4)
WBC: 10.1 10*3/uL (ref 3.4–10.8)

## 2021-03-23 LAB — TSH: TSH: 0.562 u[IU]/mL (ref 0.450–4.500)

## 2021-03-24 NOTE — Progress Notes (Signed)
R/c labs

## 2021-06-16 DIAGNOSIS — L720 Epidermal cyst: Secondary | ICD-10-CM | POA: Diagnosis not present

## 2021-08-19 ENCOUNTER — Telehealth: Payer: Self-pay | Admitting: Family

## 2021-08-19 NOTE — Telephone Encounter (Signed)
Sent patient message to call office for appointment to discuss

## 2021-09-09 ENCOUNTER — Ambulatory Visit: Payer: BC Managed Care – PPO | Admitting: Family

## 2021-09-10 DIAGNOSIS — H10013 Acute follicular conjunctivitis, bilateral: Secondary | ICD-10-CM | POA: Diagnosis not present

## 2021-11-01 ENCOUNTER — Telehealth: Payer: Self-pay | Admitting: Family

## 2021-11-01 NOTE — Telephone Encounter (Signed)
Appt made

## 2021-11-03 ENCOUNTER — Ambulatory Visit: Payer: BC Managed Care – PPO | Admitting: Family

## 2021-11-03 ENCOUNTER — Encounter: Payer: Self-pay | Admitting: Family

## 2021-11-03 VITALS — BP 119/71 | HR 80 | Temp 97.8°F | Ht 62.0 in | Wt 157.4 lb

## 2021-11-03 DIAGNOSIS — E039 Hypothyroidism, unspecified: Secondary | ICD-10-CM

## 2021-11-03 DIAGNOSIS — Z713 Dietary counseling and surveillance: Secondary | ICD-10-CM | POA: Diagnosis not present

## 2021-11-03 DIAGNOSIS — F411 Generalized anxiety disorder: Secondary | ICD-10-CM | POA: Diagnosis not present

## 2021-11-03 DIAGNOSIS — F32A Depression, unspecified: Secondary | ICD-10-CM

## 2021-11-03 MED ORDER — DESVENLAFAXINE SUCCINATE ER 100 MG PO TB24: 100.0000 mg | ORAL_TABLET | Freq: Every day | ORAL | 1 refills | Status: DC

## 2021-11-03 NOTE — Progress Notes (Signed)
Subjective:    Patient ID: Candace Torres, female    DOB: March 08, 1956, 66 y.o.   MRN: [id number]  Chief Complaint  Patient presents with   Depression    Mom had to go to rest home.    Weight Loss    Discuss wegovy    Pt presents to the office today to discuss depression. She has had to place her mother in a SNF and has had a hard time with this. Reports she goes and sees her daily. Reports she is crying all the time.   Reports since all this, she has gained 10 lbs and would like to try something for weight loss.  Depression        This is a chronic problem.  The current episode started more than 1 year ago.   The onset quality is gradual.   The problem occurs intermittently.  Associated symptoms include fatigue, irritable, restlessness, decreased interest and sad.  Associated symptoms include no helplessness and no hopelessness.     The symptoms are aggravated by family issues.  Past medical history includes thyroid problem and anxiety.   Anxiety Presents for follow-up visit. Symptoms include depressed mood, excessive worry, irritability, nervous/anxious behavior and restlessness. Symptoms occur occasionally. The severity of symptoms is moderate.    Thyroid Problem Presents for follow-up visit. Symptoms include anxiety, depressed mood, fatigue and weight gain. The symptoms have been stable.      Review of Systems  Constitutional:  Positive for fatigue, irritability and weight gain.  Psychiatric/Behavioral:  Positive for depression. The patient is nervous/anxious.   All other systems reviewed and are negative.      Objective:   Physical Exam Vitals reviewed.  Constitutional:      General: She is irritable. She is not in acute distress.    Appearance: She is well-developed.  HENT:     Head: Normocephalic and atraumatic.     Right Ear: Tympanic membrane normal.     Left Ear: Tympanic membrane normal.  Eyes:     Pupils: Pupils are equal, round, and reactive to light.   Neck:     Thyroid: No thyromegaly.  Cardiovascular:     Rate and Rhythm: Normal rate and regular rhythm.     Heart sounds: Normal heart sounds. No murmur heard. Pulmonary:     Effort: Pulmonary effort is normal. No respiratory distress.     Breath sounds: Normal breath sounds. No wheezing.  Abdominal:     General: Bowel sounds are normal. There is no distension.     Palpations: Abdomen is soft.     Tenderness: There is no abdominal tenderness.  Musculoskeletal:        General: No tenderness. Normal range of motion.     Cervical back: Normal range of motion and neck supple.  Skin:    General: Skin is warm and dry.  Neurological:     Mental Status: She is alert and oriented to person, place, and time.     Cranial Nerves: No cranial nerve deficit.     Deep Tendon Reflexes: Reflexes are normal and symmetric.  Psychiatric:        Behavior: Behavior normal.        Thought Content: Thought content normal.        Judgment: Judgment normal.       BP 119/71   Pulse 80   Temp 97.8 F (36.6 C) (Temporal)   Ht '5\' 7"'$  (1.702 m)   Wt 157 lb 6.4 oz (  71.4 kg)   BMI 24.65 kg/m      Assessment & Plan:   Candace Torres comes in today with chief complaint of Depression (Mom had to go to rest home. ) and Weight Loss (Discuss wegovy )   Diagnosis and orders addressed:  1. Depression, unspecified depression type Stop Paxil and start Pristiq 100 mg  Stress management  - desvenlafaxine (PRISTIQ) 100 MG 24 hr tablet; Take 1 tablet (100 mg total) by mouth daily.  Dispense: 90 tablet; Refill: 1  2. GAD (generalized anxiety disorder) - desvenlafaxine (PRISTIQ) 100 MG 24 hr tablet; Take 1 tablet (100 mg total) by mouth daily.  Dispense: 90 tablet; Refill: 1  3. Encounter for weight loss counseling Her BMI is normal  Insurance will not cover Weovy   4. Hypothyroidism, unspecified type Labs pending  - TSH   Labs pending Health Maintenance reviewed Diet and exercise  encouraged  Follow up plan: Keep chronic follow up  Candace Dun, FNP

## 2021-11-03 NOTE — Patient Instructions (Signed)
Major Depressive Disorder, Adult Major depressive disorder (MDD) is a mental health condition. It may also be called clinical depression or unipolar depression. MDD causes symptoms of sadness, hopelessness, and loss of interest in things. These symptoms last most of the day, almost every day, for 2 weeks. MDD can also cause physical symptoms. It can interfere with relationships and with everyday activities, such as work, school, and activities that are usually pleasant. MDD may be mild, moderate, or severe. It may be single-episode MDD, which happens once, or recurrent MDD, which may occur multiple times. What are the causes? The exact cause of this condition is not known. MDD is most likely caused by a combination of things, which may include: Your personality traits. Learned or conditioned behaviors or thoughts or feelings that reinforce negativity. Any alcohol or substance misuse. Long-term (chronic) physical or mental health illness. Going through a traumatic experience or major life changes. What increases the risk? The following factors may make someone more likely to develop MDD: A family history of depression. Being a woman. Troubled family relationships. Abnormally low levels of certain brain chemicals. Traumatic or painful events in childhood, especially abuse or loss of a parent. A lot of stress from life experiences, such as poor living conditions or discrimination. Chronic physical illness or other mental health disorders. What are the signs or symptoms? The main symptoms of MDD usually include: Constant depressed or irritable mood. A loss of interest in things and activities. Other symptoms include: Sleeping or eating too much or too little. Unexplained weight gain or weight loss. Tiredness or low energy. Being agitated, restless, or weak. Feeling hopeless, worthless, or guilty. Trouble thinking clearly or making decisions. Thoughts of suicide or thoughts of harming  others. Isolating oneself or avoiding other people or activities. Trouble completing tasks, work, or any normal obligations. Severe symptoms of this condition may include: Psychotic depression.This may include false beliefs, or delusions. It may also include seeing, hearing, tasting, smelling, or feeling things that are not real (hallucinations). Chronic depression or persistent depressive disorder. This is low-level depression that lasts for at least 2 years. Melancholic depression, or feeling extremely sad and hopeless. Catatonic depression, which includes trouble speaking and trouble moving. How is this diagnosed? This condition may be diagnosed based on: Your symptoms. Your medical and mental health history. You may be asked questions about your lifestyle, including any drug and alcohol use. A physical exam. Blood tests to rule out other conditions. MDD is confirmed if you have the following symptoms most of the day, nearly every day, in a 2-week period: Either a depressed mood or loss of interest. At least four other MDD symptoms. How is this treated? This condition is usually treated by mental health professionals, such as psychologists, psychiatrists, and clinical social workers. You may need more than one type of treatment. Treatment may include: Psychotherapy, also called talk therapy or counseling. Types of psychotherapy include: Cognitive behavioral therapy (CBT). This teaches you to recognize unhealthy feelings, thoughts, and behaviors, and replace them with positive thoughts and actions. Interpersonal therapy (IPT). This helps you to improve the way you communicate with others or relate to them. Family therapy. This treatment includes members of your family. Medicines to treat anxiety and depression. These medicines help to balance the brain chemicals that affect your emotions. Lifestyle changes. You may be asked to: Limit alcohol use and avoid drug use. Get regular  exercise. Get plenty of sleep. Make healthy eating choices. Spend more time outdoors. Brain stimulation. This may  be done if symptoms are very severe and other treatments have not worked. Examples of this treatment are electroconvulsive therapy and transcranial magnetic stimulation. Follow these instructions at home: Activity Exercise regularly and spend time outdoors. Find activities that you enjoy doing, and make time to do them. Find healthy ways to manage stress, such as: Meditation or deep breathing. Spending time in nature. Journaling. Return to your normal activities as told by your health care provider. Ask your health care provider what activities are safe for you. Alcohol and drug use If you drink alcohol: Limit how much you use to: 0-1 drink a day for women who are not pregnant. 0-2 drinks a day for men. Be aware of how much alcohol is in your drink. In the [place] one drink equals one 12 oz bottle of beer (355 mL), one 5 oz glass of wine (148 mL), or one 1 oz glass of hard liquor (44 mL). Discuss your alcohol use with your health care provider. Alcohol can affect any antidepressant medicines you are taking. Discuss any drug use with your health care provider. General instructions  Take over-the-counter and prescription medicines only as told by your health care provider. Eat a healthy diet and get plenty of sleep. Consider joining a support group. Your health care provider may be able to recommend one. Keep all follow-up visits as told by your health care provider. This is important. Where to find more information [employer] on Mental Illness: www.nami.[place]: https://carter.com/ Contact a health care provider if: Your symptoms get worse. You develop new symptoms. Get help right away if: You self-harm. You have serious thoughts about hurting yourself or others. You hallucinate. If you ever feel like you may hurt yourself or  others, or have thoughts about taking your own life, get help right away. Go to your nearest emergency department or: Call your local emergency services (911 in the [place] Call a suicide crisis helpline, such as the [employer] at 619-251-0602 or 988 in the [place]. This is open 24 hours a day in the [place] Text the Crisis Text Line at 828 312 6086 (in the [place].). Summary Major depressive disorder (MDD) is a mental health condition. MDD causes symptoms of sadness, hopelessness, and loss of interest in things. These symptoms last most of the day, almost every day, for 2 weeks. The symptoms of MDD can interfere with relationships and with everyday activities. Treatments and support are available for people who develop MDD. You may need more than one type of treatment. Get help right away if you have serious thoughts about hurting yourself or others. This information is not intended to replace advice given to you by your health care provider. Make sure you discuss any questions you have with your health care provider. Document Revised: 10/13/2020 Document Reviewed: 03/01/2019 Elsevier Patient Education  [place].

## 2021-11-04 LAB — TSH: TSH: 1.86 u[IU]/mL (ref 0.450–4.500)

## 2021-11-29 DIAGNOSIS — Z1231 Encounter for screening mammogram for malignant neoplasm of breast: Secondary | ICD-10-CM | POA: Diagnosis not present

## 2022-02-05 DIAGNOSIS — K219 Gastro-esophageal reflux disease without esophagitis: Secondary | ICD-10-CM | POA: Diagnosis not present

## 2022-02-05 DIAGNOSIS — Z79890 Hormone replacement therapy (postmenopausal): Secondary | ICD-10-CM | POA: Diagnosis not present

## 2022-02-05 DIAGNOSIS — L245 Irritant contact dermatitis due to other chemical products: Secondary | ICD-10-CM | POA: Diagnosis not present

## 2022-02-05 DIAGNOSIS — Z79899 Other long term (current) drug therapy: Secondary | ICD-10-CM | POA: Diagnosis not present

## 2022-02-05 DIAGNOSIS — R21 Rash and other nonspecific skin eruption: Secondary | ICD-10-CM | POA: Diagnosis not present

## 2022-02-05 DIAGNOSIS — L253 Unspecified contact dermatitis due to other chemical products: Secondary | ICD-10-CM | POA: Diagnosis not present

## 2022-02-05 DIAGNOSIS — E039 Hypothyroidism, unspecified: Secondary | ICD-10-CM | POA: Diagnosis not present

## 2022-02-24 IMAGING — US US RENAL
1 series · 14 of 25 positions shown · non-contrast
Comparison: 12/15/2019 CT abdomen

CLINICAL DATA: Stage IIIA chronic kidney disease.

EXAM:
RENAL / URINARY TRACT ULTRASOUND COMPLETE

[Series 1: us renal · 14 of 48 slices shown]
[im 1/48]
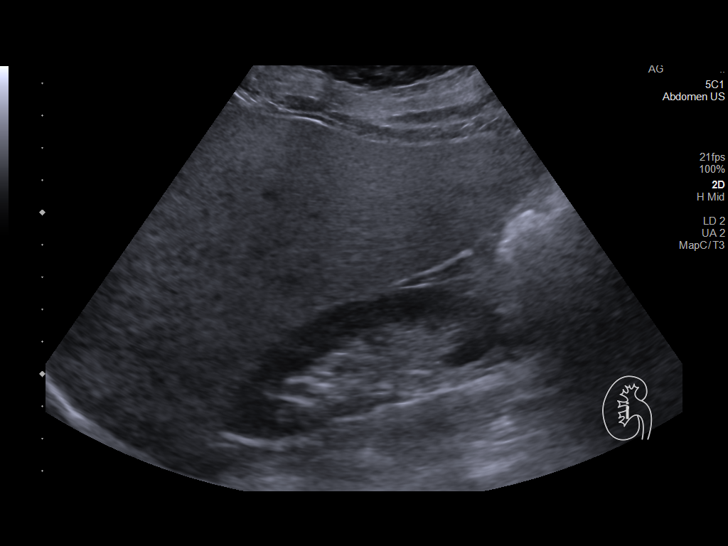
[im 4/48]
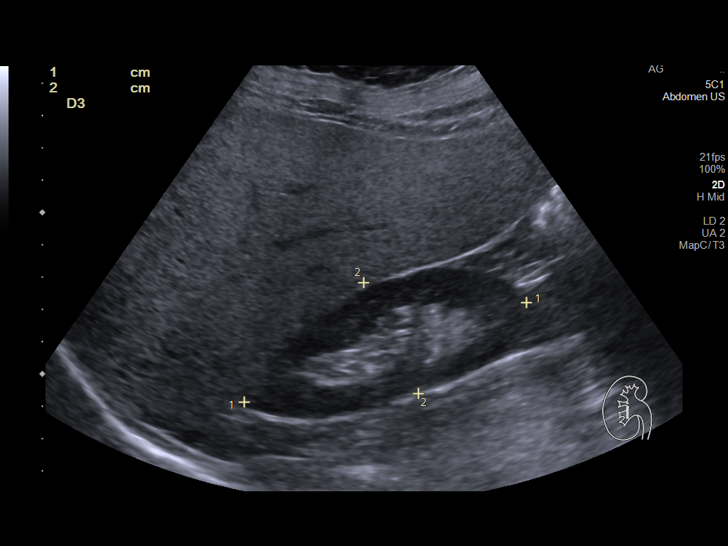
[im 8/48]
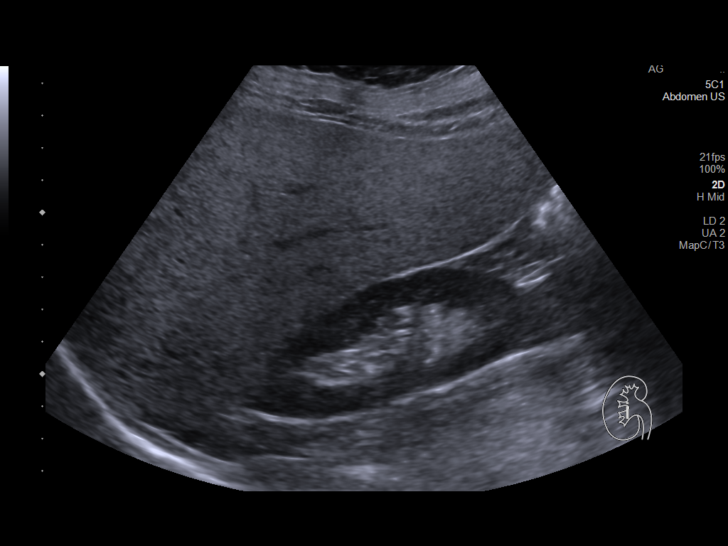
[im 12/48]
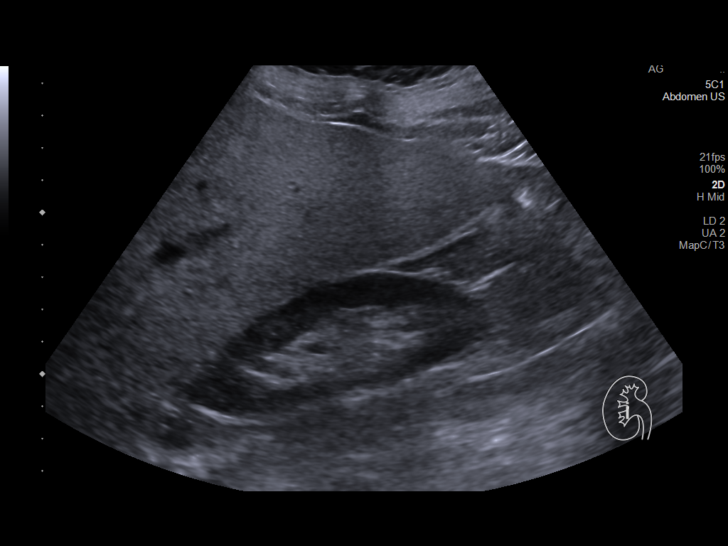
[im 16/48]
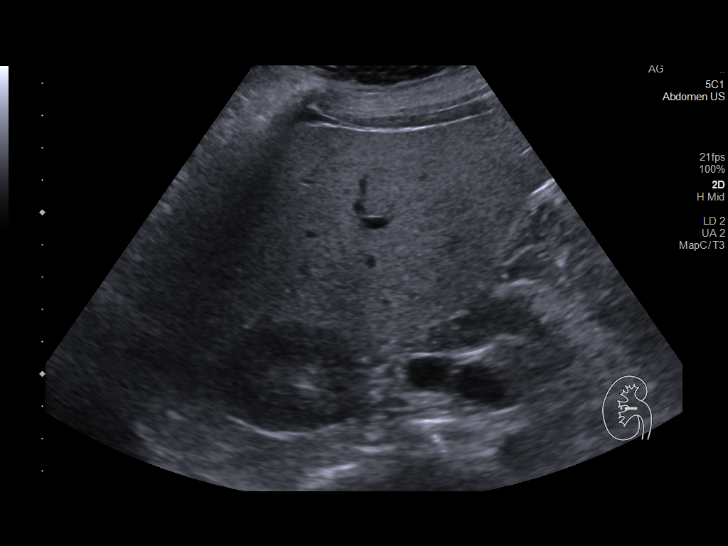
[im 18/48]
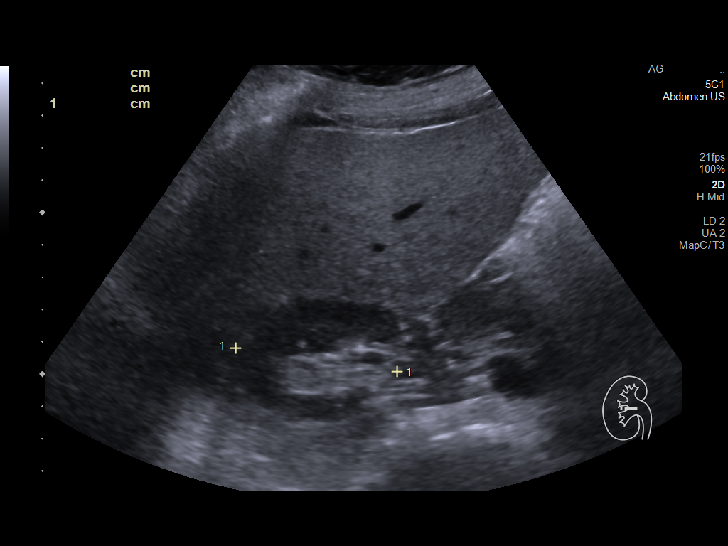
[im 22/48]
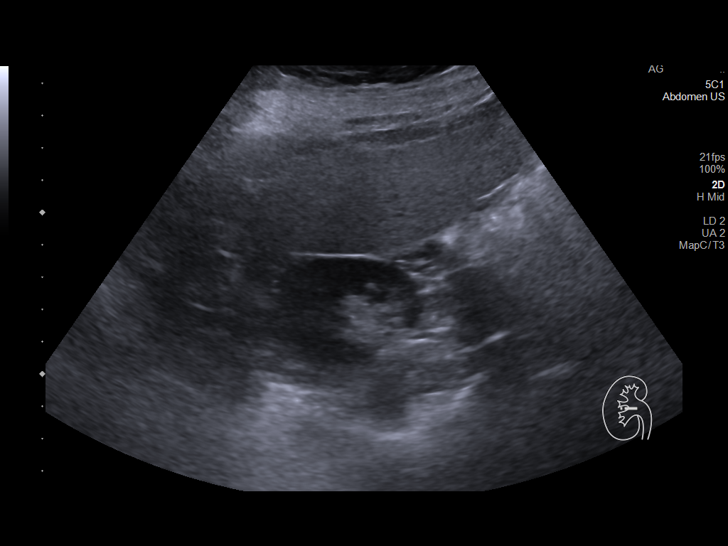
[im 26/48]
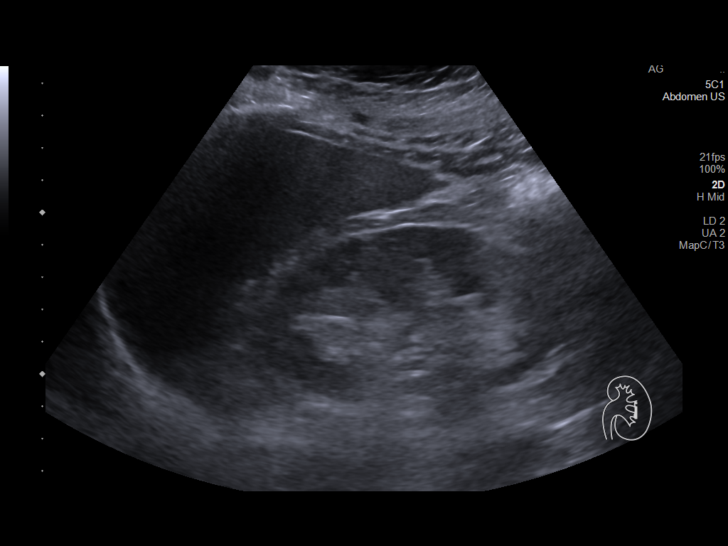
[im 30/48]
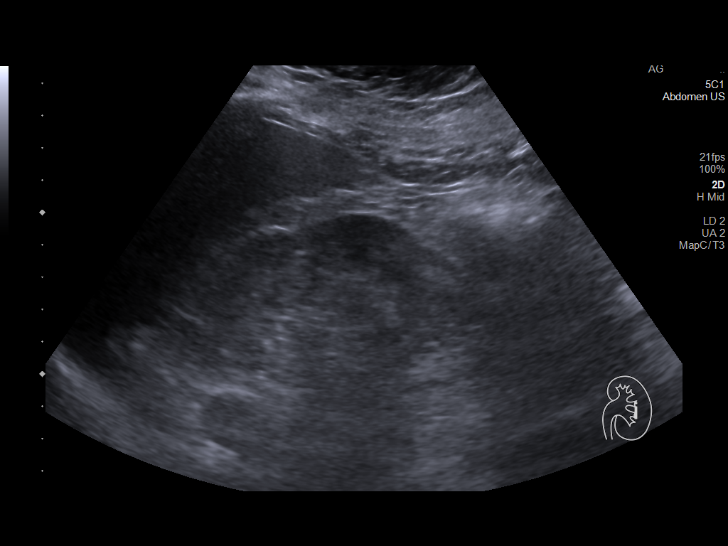
[im 32/48]
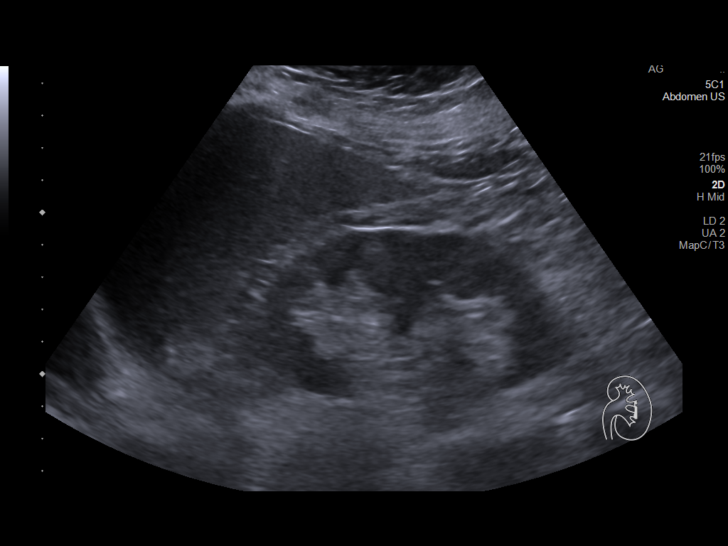
[im 36/48]
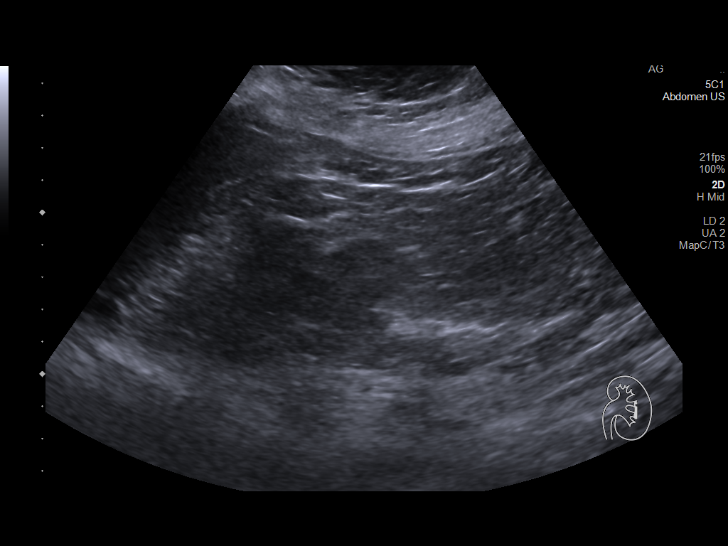
[im 40/48]
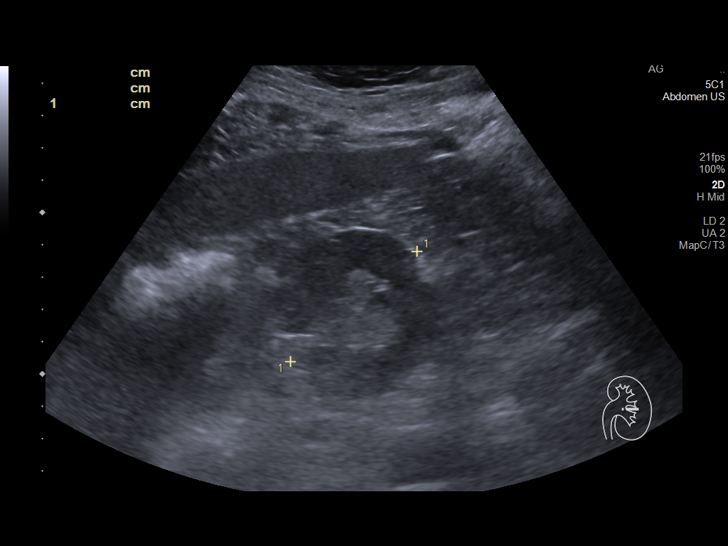
[im 44/48]
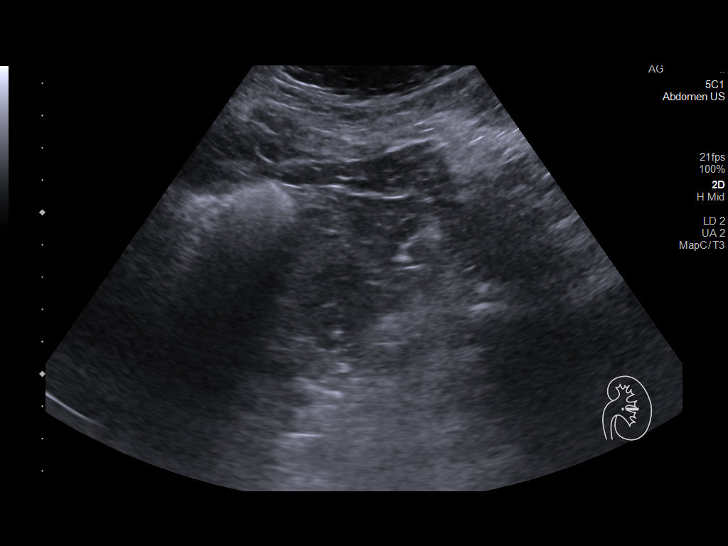
[im 48/48]
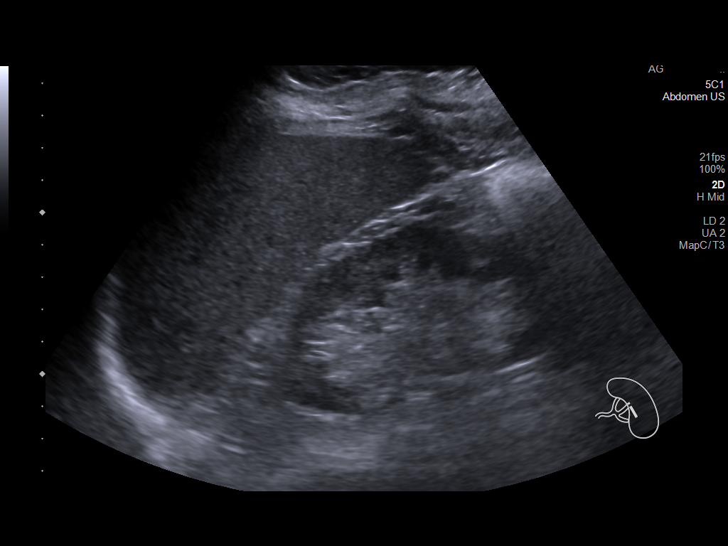

[14 of 25 positions shown; findings below may reference images not displayed]

FINDINGS: Right Kidney:

Renal measurements: 9.3 x 3.8 x 5.1 cm = volume: 94 mL. Echogenicity
within normal limits. Normal parenchymal thickness (11 mm). No mass
or hydronephrosis visualized.

Left Kidney:

Renal measurements: 9.6 x 5.4 x 5.2 cm = volume: 140 mL.
Echogenicity within normal limits. Normal parenchymal thickness (11
mm). No mass or hydronephrosis visualized.

Bladder:

Appears normal for degree of bladder distention.

Other:

Incidentally noted echogenic right liver parenchyma.
IMPRESSION: 1. Normal kidneys.  No hydronephrosis.
2. Incidental diffuse hepatic steatosis.

## 2022-03-30 ENCOUNTER — Encounter: Payer: Self-pay | Admitting: Family

## 2022-03-30 ENCOUNTER — Ambulatory Visit (INDEPENDENT_AMBULATORY_CARE_PROVIDER_SITE_OTHER): Payer: BC Managed Care – PPO | Admitting: Family

## 2022-03-30 VITALS — BP 135/67 | HR 80 | Temp 97.9°F | Ht 62.0 in | Wt 152.0 lb

## 2022-03-30 DIAGNOSIS — Z0001 Encounter for general adult medical examination with abnormal findings: Secondary | ICD-10-CM | POA: Diagnosis not present

## 2022-03-30 DIAGNOSIS — Z23 Encounter for immunization: Secondary | ICD-10-CM

## 2022-03-30 DIAGNOSIS — F411 Generalized anxiety disorder: Secondary | ICD-10-CM

## 2022-03-30 DIAGNOSIS — F32A Depression, unspecified: Secondary | ICD-10-CM

## 2022-03-30 DIAGNOSIS — K219 Gastro-esophageal reflux disease without esophagitis: Secondary | ICD-10-CM

## 2022-03-30 DIAGNOSIS — Z0000 Encounter for general adult medical examination without abnormal findings: Secondary | ICD-10-CM

## 2022-03-30 DIAGNOSIS — E039 Hypothyroidism, unspecified: Secondary | ICD-10-CM

## 2022-03-30 DIAGNOSIS — F3341 Major depressive disorder, recurrent, in partial remission: Secondary | ICD-10-CM | POA: Diagnosis not present

## 2022-03-30 DIAGNOSIS — N183 Chronic kidney disease, stage 3 unspecified: Secondary | ICD-10-CM

## 2022-03-30 DIAGNOSIS — L0291 Cutaneous abscess, unspecified: Secondary | ICD-10-CM

## 2022-03-30 MED ORDER — OMEPRAZOLE 40 MG PO CPDR: 40.0000 mg | DELAYED_RELEASE_CAPSULE | Freq: Every day | ORAL | 3 refills | Status: DC

## 2022-03-30 MED ORDER — DESVENLAFAXINE SUCCINATE ER 100 MG PO TB24: 100.0000 mg | ORAL_TABLET | Freq: Every day | ORAL | 1 refills | Status: DC

## 2022-03-30 MED ORDER — BUPROPION HCL ER (XL) 300 MG PO TB24: 300.0000 mg | ORAL_TABLET | Freq: Every day | ORAL | 0 refills | Status: DC

## 2022-03-30 MED ORDER — MUPIROCIN 2 % EX OINT: 1.0000 | TOPICAL_OINTMENT | Freq: Two times a day (BID) | CUTANEOUS | 0 refills | Status: DC

## 2022-03-30 NOTE — Progress Notes (Signed)
Subjective:    Patient ID: Candace Torres, female    DOB: 02/25/1956, 66 y.o.   MRN: [id number]  Chief Complaint  Patient presents with   Annual Exam   PT presents to the office today for CPE without pap. She is followed by Nephrologists for CKD.    Pt states she was boiling water two days ago and some splattered on her right lower abdomen. States it is erythemas. Denies any fever or pain.  Gastroesophageal Reflux She complains of belching and heartburn. She reports no hoarse voice. This is a chronic problem. The current episode started more than 1 year ago. The problem occurs rarely. The problem has been waxing and waning. Pertinent negatives include no fatigue. She has tried a PPI for the symptoms. The treatment provided moderate relief.  Thyroid Problem Presents for follow-up visit. Symptoms include anxiety. Patient reports no dry skin, fatigue or hoarse voice. The symptoms have been stable.  Anxiety Presents for follow-up visit. Symptoms include excessive worry, irritability and nervous/anxious behavior. Symptoms occur rarely. The severity of symptoms is mild.    Depression        This is a chronic problem.  The current episode started more than 1 year ago.   The problem occurs intermittently.  Associated symptoms include no fatigue, no helplessness, no hopelessness and not sad.  Past treatments include SNRIs - Serotonin and norepinephrine reuptake inhibitors.  Past medical history includes thyroid problem and anxiety.       Review of Systems  Constitutional:  Positive for irritability. Negative for fatigue.  HENT:  Negative for hoarse voice.   Gastrointestinal:  Positive for heartburn.  Psychiatric/Behavioral:  Positive for depression. The patient is nervous/anxious.   All other systems reviewed and are negative.      Objective:   Physical Exam Vitals reviewed.  Constitutional:      General: She is not in acute distress.    Appearance: She is well-developed.  HENT:      Head: Normocephalic and atraumatic.     Right Ear: Tympanic membrane normal.     Left Ear: Tympanic membrane normal.  Eyes:     Pupils: Pupils are equal, round, and reactive to light.  Neck:     Thyroid: No thyromegaly.  Cardiovascular:     Rate and Rhythm: Normal rate and regular rhythm.     Heart sounds: Normal heart sounds. No murmur heard. Pulmonary:     Effort: Pulmonary effort is normal. No respiratory distress.     Breath sounds: Normal breath sounds. No wheezing.  Abdominal:     General: Bowel sounds are normal. There is no distension.     Palpations: Abdomen is soft.     Tenderness: There is no abdominal tenderness.  Musculoskeletal:        General: No tenderness. Normal range of motion.     Cervical back: Normal range of motion and neck supple.  Skin:    General: Skin is warm and dry.          Comments: Erythemas abscess 2X1cm, purulent discharge  Neurological:     Mental Status: She is alert and oriented to person, place, and time.     Cranial Nerves: No cranial nerve deficit.     Deep Tendon Reflexes: Reflexes are normal and symmetric.  Psychiatric:        Behavior: Behavior normal.        Thought Content: Thought content normal.        Judgment: Judgment normal.  Abscess cleaned and squeezed with slight purulent white discharge. Antibiotic ointment applied and band-aid. Pt tolerated well.   BP 135/67   Pulse 80   Temp 97.9 F (36.6 C) (Temporal)   Ht _0  (1.575 m)   Wt 152 lb (68.9 kg)   SpO2 99%   BMI 27.80 kg/m        Assessment & Plan:  Candace Torres comes in today with chief complaint of Annual Exam   Diagnosis and orders addressed:  1. Recurrent major depressive disorder, in partial remission (HCC) - buPROPion (WELLBUTRIN XL) 300 MG 24 hr tablet; Take 1 tablet (300 mg total) by mouth daily.  Dispense: 90 tablet; Refill: 0 - CMP14+EGFR  2. GAD (generalized anxiety disorder) - desvenlafaxine (PRISTIQ) 100 MG 24 hr tablet;  Take 1 tablet (100 mg total) by mouth daily.  Dispense: 90 tablet; Refill: 1 - CMP14+EGFR  3. Depression, unspecified depression type - desvenlafaxine (PRISTIQ) 100 MG 24 hr tablet; Take 1 tablet (100 mg total) by mouth daily.  Dispense: 90 tablet; Refill: 1 - CMP14+EGFR  4. Annual physical exam - CMP14+EGFR - CBC with Differential/Platelet - Lipid panel - TSH  5. Stage 3 chronic kidney disease, unspecified whether stage 3a or 3b CKD (HCC) - CMP14+EGFR  6. Hypothyroidism, unspecified type - CMP14+EGFR - TSH  7. Gastroesophageal reflux disease, unspecified whether esophagitis present - CMP14+EGFR  8. Need for immunization against influenza - Flu Vaccine QUAD High Dose(Fluad) - CMP14+EGFR  9. Abscess Warm compresses  Keep clean and dry Apply bactroban - CMP14+EGFR - mupirocin ointment (BACTROBAN) 2 %; Apply 1 Application topically 2 (two) times daily.  Dispense: 22 g; Refill: 0   Labs pending Health Maintenance reviewed Diet and exercise encouraged  Follow up plan: 6 months   Evelina Dun, FNP

## 2022-03-30 NOTE — Patient Instructions (Signed)
Health Maintenance After Age 68 After age 68, you are at a higher risk for certain long-term diseases and infections as well as injuries from falls. Falls are a major cause of broken bones and head injuries in people who are older than age 68. Getting regular preventive care can help to keep you healthy and well. Preventive care includes getting regular testing and making lifestyle changes as recommended by your health care provider. Talk with your health care provider about: Which screenings and tests you should have. A screening is a test that checks for a disease when you have no symptoms. A diet and exercise plan that is right for you. What should I know about screenings and tests to prevent falls? Screening and testing are the best ways to find a health problem early. Early diagnosis and treatment give you the best chance of managing medical conditions that are common after age 68. Certain conditions and lifestyle choices may make you more likely to have a fall. Your health care provider may recommend: Regular vision checks. Poor vision and conditions such as cataracts can make you more likely to have a fall. If you wear glasses, make sure to get your prescription updated if your vision changes. Medicine review. Work with your health care provider to regularly review all of the medicines you are taking, including over-the-counter medicines. Ask your health care provider about any side effects that may make you more likely to have a fall. Tell your health care provider if any medicines that you take make you feel dizzy or sleepy. Strength and balance checks. Your health care provider may recommend certain tests to check your strength and balance while standing, walking, or changing positions. Foot health exam. Foot pain and numbness, as well as not wearing proper footwear, can make you more likely to have a fall. Screenings, including: Osteoporosis screening. Osteoporosis is a condition that causes  the bones to get weaker and break more easily. Blood pressure screening. Blood pressure changes and medicines to control blood pressure can make you feel dizzy. Depression screening. You may be more likely to have a fall if you have a fear of falling, feel depressed, or feel unable to do activities that you used to do. Alcohol use screening. Using too much alcohol can affect your balance and may make you more likely to have a fall. Follow these instructions at home: Lifestyle Do not drink alcohol if: Your health care provider tells you not to drink. If you drink alcohol: Limit how much you have to: 0-1 drink a day for women. 0-2 drinks a day for men. Know how much alcohol is in your drink. In the [place] one drink equals one 12 oz bottle of beer (355 mL), one 5 oz glass of wine (148 mL), or one 1 oz glass of hard liquor (44 mL). Do not use any products that contain nicotine or tobacco. These products include cigarettes, chewing tobacco, and vaping devices, such as e-cigarettes. If you need help quitting, ask your health care provider. Activity  Follow a regular exercise program to stay fit. This will help you maintain your balance. Ask your health care provider what types of exercise are appropriate for you. If you need a cane or walker, use it as recommended by your health care provider. Wear supportive shoes that have nonskid soles. Safety  Remove any tripping hazards, such as rugs, cords, and clutter. Install safety equipment such as grab bars in bathrooms and safety rails on stairs. Keep rooms and walkways   well-lit. General instructions Talk with your health care provider about your risks for falling. Tell your health care provider if: You fall. Be sure to tell your health care provider about all falls, even ones that seem minor. You feel dizzy, tiredness (fatigue), or off-balance. Take over-the-counter and prescription medicines only as told by your health care provider. These include  supplements. Eat a healthy diet and maintain a healthy weight. A healthy diet includes low-fat dairy products, low-fat (lean) meats, and fiber from whole grains, beans, and lots of fruits and vegetables. Stay current with your vaccines. Schedule regular health, dental, and eye exams. Summary Having a healthy lifestyle and getting preventive care can help to protect your health and wellness after age 68. Screening and testing are the best way to find a health problem early and help you avoid having a fall. Early diagnosis and treatment give you the best chance for managing medical conditions that are more common for people who are older than age 68. Falls are a major cause of broken bones and head injuries in people who are older than age 68. Take precautions to prevent a fall at home. Work with your health care provider to learn what changes you can make to improve your health and wellness and to prevent falls. This information is not intended to replace advice given to you by your health care provider. Make sure you discuss any questions you have with your health care provider. Document Revised: 08/09/2020 Document Reviewed: 08/09/2020 Elsevier Patient Education  2023 [employer] Inc[employer]  

## 2022-03-31 LAB — CMP14+EGFR
ALT: 20 IU/L (ref 0–32)
AST: 18 IU/L (ref 0–40)
Albumin/Globulin Ratio: 1.8 (ref 1.2–2.2)
Albumin: 4.6 g/dL (ref 3.9–4.9)
Alkaline Phosphatase: 97 IU/L (ref 44–121)
BUN/Creatinine Ratio: 7 — ABNORMAL LOW (ref 12–28)
BUN: 8 mg/dL (ref 8–27)
Bilirubin Total: 0.5 mg/dL (ref 0.0–1.2)
CO2: 20 mmol/L (ref 20–29)
Calcium: 9.9 mg/dL (ref 8.7–10.3)
Chloride: 103 mmol/L (ref 96–106)
Creatinine, Ser: 1.19 mg/dL — ABNORMAL HIGH (ref 0.57–1.00)
Globulin, Total: 2.6 g/dL (ref 1.5–4.5)
Glucose: 96 mg/dL (ref 70–99)
Potassium: 4 mmol/L (ref 3.5–5.2)
Sodium: 140 mmol/L (ref 134–144)
Total Protein: 7.2 g/dL (ref 6.0–8.5)
eGFR: 50 mL/min/{1.73_m2} — ABNORMAL LOW (ref >59)

## 2022-03-31 LAB — CBC WITH DIFFERENTIAL/PLATELET
Basophils Absolute: 0.1 10*3/uL (ref 0.0–0.2)
Basos: 2 %
EOS (ABSOLUTE): 0 10*3/uL (ref 0.0–0.4)
Eos: 0 %
Hematocrit: 42.4 % (ref 34.0–46.6)
Hemoglobin: 14.3 g/dL (ref 11.1–15.9)
Immature Grans (Abs): 0 10*3/uL (ref 0.0–0.1)
Immature Granulocytes: 0 %
Lymphocytes Absolute: 1.8 10*3/uL (ref 0.7–3.1)
Lymphs: 21 %
MCH: 30.3 pg (ref 26.6–33.0)
MCHC: 33.7 g/dL (ref 31.5–35.7)
MCV: 90 fL (ref 79–97)
Monocytes Absolute: 0.7 10*3/uL (ref 0.1–0.9)
Monocytes: 8 %
Neutrophils Absolute: 5.9 10*3/uL (ref 1.4–7.0)
Neutrophils: 69 %
Platelets: 214 10*3/uL (ref 150–450)
RBC: 4.72 x10E6/uL (ref 3.77–5.28)
RDW: 12.2 % (ref 11.7–15.4)
WBC: 8.5 10*3/uL (ref 3.4–10.8)

## 2022-03-31 LAB — LIPID PANEL
Chol/HDL Ratio: 4.7 ratio — ABNORMAL HIGH (ref 0.0–4.4)
Cholesterol, Total: 187 mg/dL (ref 100–199)
HDL: 40 mg/dL (ref >39)
LDL Chol Calc (NIH): 120 mg/dL — ABNORMAL HIGH (ref 0–99)
Triglycerides: 153 mg/dL — ABNORMAL HIGH (ref 0–149)
VLDL Cholesterol Cal: 27 mg/dL (ref 5–40)

## 2022-03-31 LAB — TSH: TSH: 3.09 u[IU]/mL (ref 0.450–4.500)

## 2022-04-04 ENCOUNTER — Other Ambulatory Visit: Payer: Self-pay | Admitting: Family

## 2022-04-04 MED ORDER — ROSUVASTATIN CALCIUM 5 MG PO TABS: 5.0000 mg | ORAL_TABLET | Freq: Every day | ORAL | 0 refills | Status: DC

## 2022-04-05 NOTE — Progress Notes (Signed)
R/c

## 2022-05-12 DIAGNOSIS — Z6827 Body mass index (BMI) 27.0-27.9, adult: Secondary | ICD-10-CM | POA: Diagnosis not present

## 2022-05-12 DIAGNOSIS — I5032 Chronic diastolic (congestive) heart failure: Secondary | ICD-10-CM | POA: Diagnosis not present

## 2022-05-12 DIAGNOSIS — N1831 Chronic kidney disease, stage 3a: Secondary | ICD-10-CM | POA: Diagnosis not present

## 2022-05-12 DIAGNOSIS — D3502 Benign neoplasm of left adrenal gland: Secondary | ICD-10-CM | POA: Diagnosis not present

## 2022-06-05 ENCOUNTER — Other Ambulatory Visit: Payer: Self-pay | Admitting: Family

## 2022-06-05 DIAGNOSIS — F3341 Major depressive disorder, recurrent, in partial remission: Secondary | ICD-10-CM

## 2022-06-27 IMAGING — MR MR ABDOMEN WO/W CM
21 series · 48 of 48 positions shown · IV contrast (8 ml Gadavist)
Comparison: 10/29/2020, and CT on 12/15/2019

CLINICAL DATA: Follow-up left adrenal mass.

EXAM:
MRI ABDOMEN WITHOUT AND WITH CONTRAST
TECHNIQUE: Multiplanar multisequence MR imaging of the abdomen was performed
both before and after the administration of intravenous contrast.
CONTRAST:  7mL GADAVIST GADOBUTROL 1 MMOL/ML IV SOLN

[Series 3: cor haste · coronal · 6.0mm · 1.19mm/px · 1 of 30 slices shown]
[im 1/30]
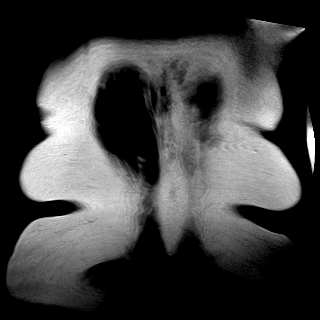

[Series 4: ax haste · axial · 6.0mm · 1.19mm/px · 1 of 34 slices shown]
[im 1/34]
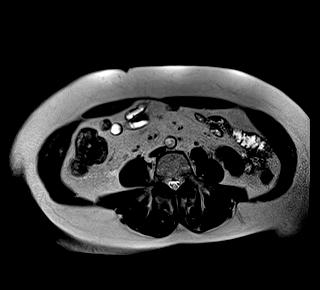

[Series 6: T2 fat-sat · axial · 6.0mm · 1.19mm/px · 1 of 30 slices shown]
[im 1/30]
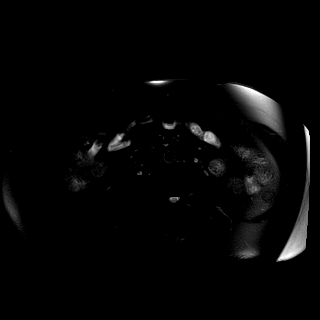

[Series 7: ax in and · axial · 3.0mm · 1.19mm/px · z∈[-65,+148]mm · 2 of 72 slices shown (1 of 2)]
[im 1/72]
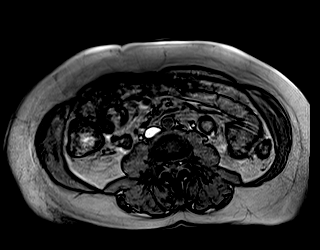
[im 72/72]
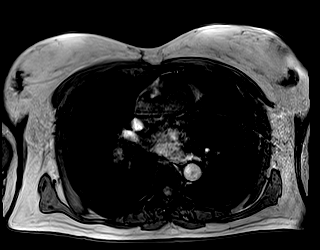

[Series 8: ax in and · axial · 3.0mm · 1.19mm/px · z∈[-65,+148]mm · 3 of 72 slices shown (2 of 2)]
[im 1/72]
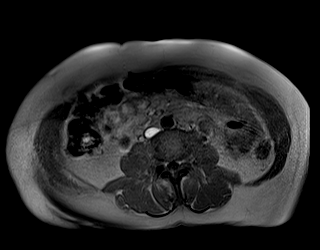
[im 36/72]
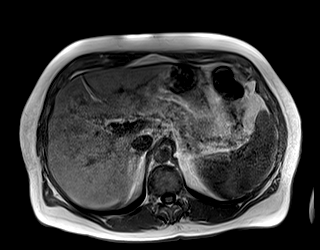
[im 72/72]
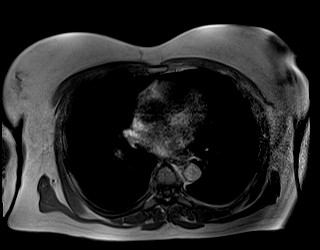

[Series 9: cor in-opp_opp · coronal · 3.0mm · 1.25mm/px · 3 of 72 slices shown]
[im 1/72]
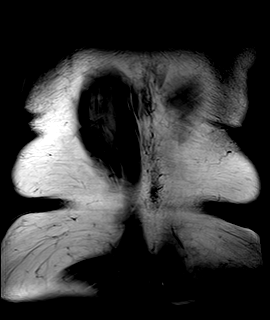
[im 36/72]
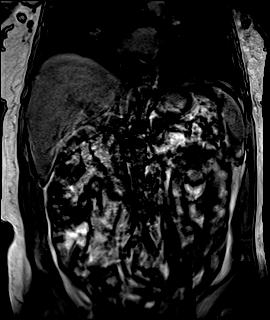
[im 72/72]
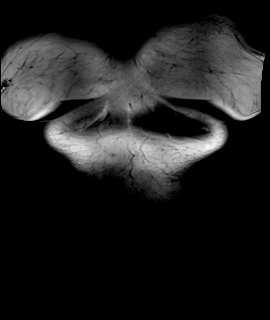

[Series 10: cor in-opp_in · coronal · 3.0mm · 1.25mm/px · 3 of 72 slices shown]
[im 1/72]
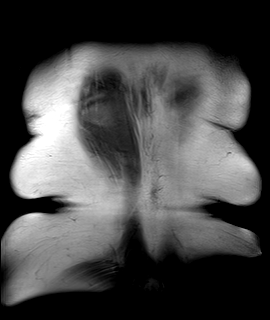
[im 36/72]
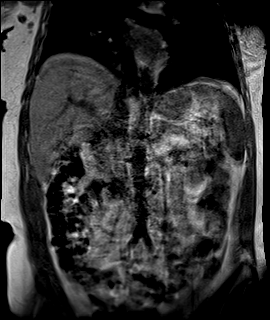
[im 72/72]
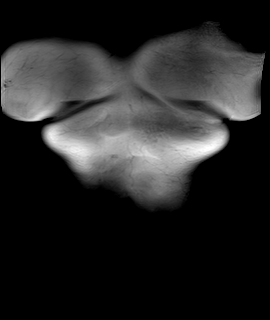

[Series 11: DWI · axial · 6.0mm · 1.42mm/px · 1 of 30 slices shown (1 of 4)]
[im 1/30]
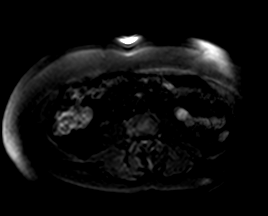

[Series 11: DWI · axial · 6.0mm · 1.42mm/px · 1 of 30 slices shown (2 of 4)]
[im 1/30]
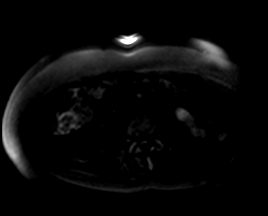

[Series 11: DWI · axial · 6.0mm · 1.42mm/px · 1 of 30 slices shown (3 of 4)]
[im 1/30]
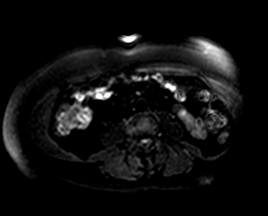

[Series 12: DWI · axial · 6.0mm · 1.42mm/px · 1 of 30 slices shown (4 of 4)]
[im 1/30]
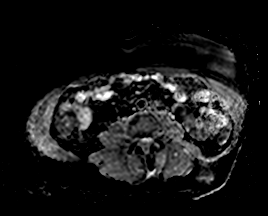

[Series 13: T1 dynamic · axial · non-contrast · 3.0mm · 1.19mm/px · z∈[-65,+148]mm · 3 of 72 slices shown (1 of 5)]
[im 1/72]
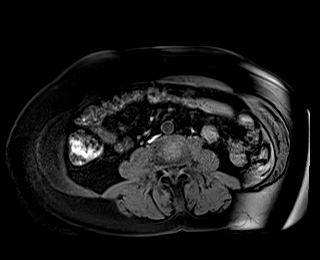
[im 36/72]
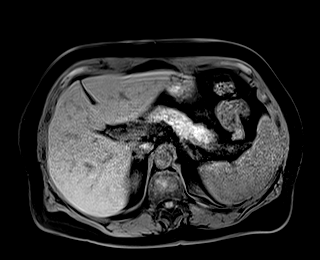
[im 72/72]
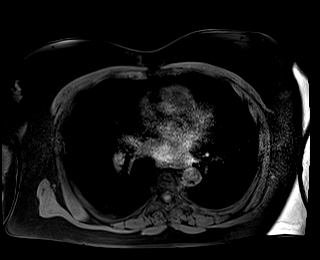

[Series 15: T1 dynamic post-contrast · axial · 3.0mm · 1.19mm/px · z∈[-65,+148]mm · 3 of 72 slices shown (1 of 5)]
[im 1/72]
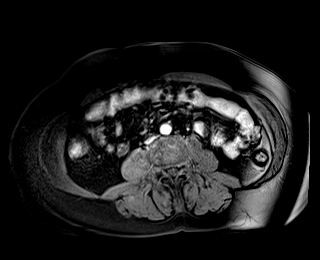
[im 36/72]
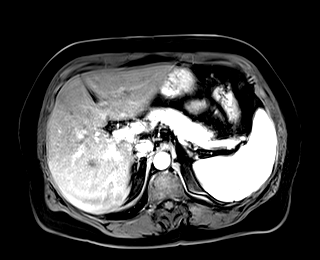
[im 72/72]
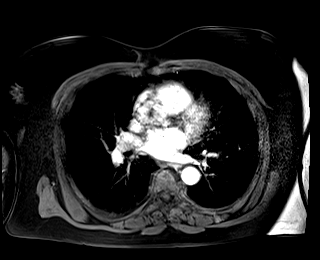

[Series 16: T1 dynamic · axial · 3.0mm · 1.19mm/px · z∈[-65,+148]mm · 3 of 72 slices shown (2 of 5)]
[im 1/72]
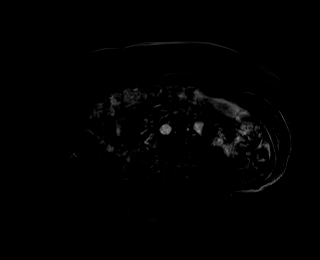
[im 36/72]
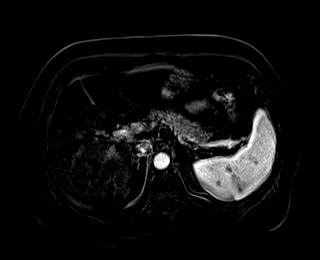
[im 72/72]
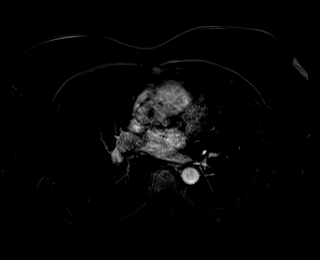

[Series 17: T1 dynamic post-contrast · axial · 3.0mm · 1.19mm/px · z∈[-65,+148]mm · 3 of 72 slices shown (2 of 5)]
[im 1/72]
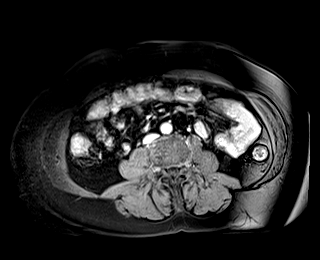
[im 36/72]
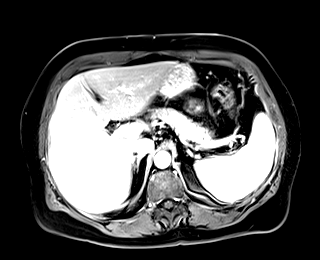
[im 72/72]
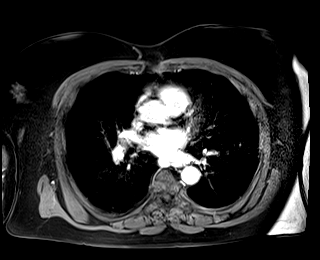

[Series 18: T1 dynamic · axial · 3.0mm · 1.19mm/px · z∈[-65,+148]mm · 3 of 72 slices shown (3 of 5)]
[im 1/72]
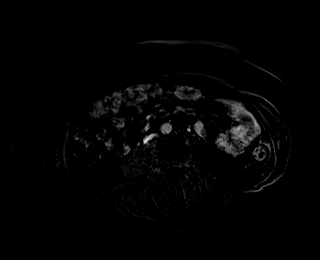
[im 36/72]
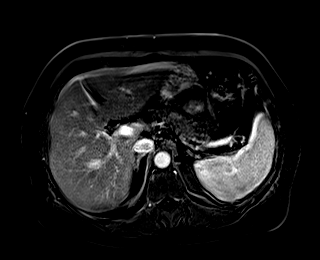
[im 72/72]
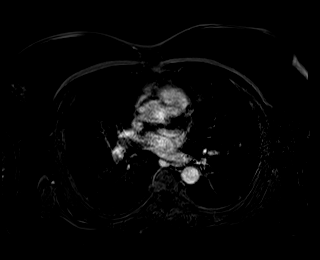

[Series 19: T1 dynamic post-contrast · axial · 3.0mm · 1.19mm/px · z∈[-65,+148]mm · 3 of 72 slices shown (3 of 5)]
[im 1/72]
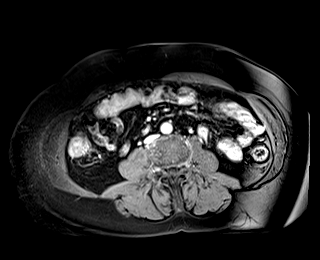
[im 36/72]
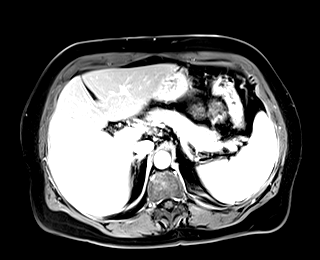
[im 72/72]
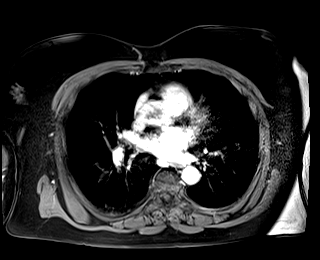

[Series 20: T1 dynamic · axial · 3.0mm · 1.19mm/px · z∈[-65,+148]mm · 3 of 72 slices shown (4 of 5)]
[im 1/72]
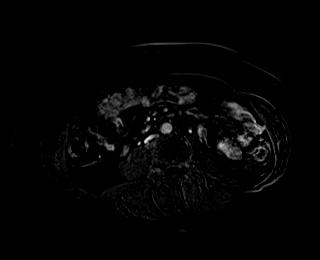
[im 36/72]
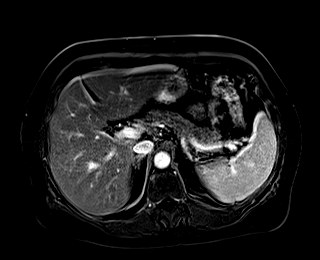
[im 72/72]
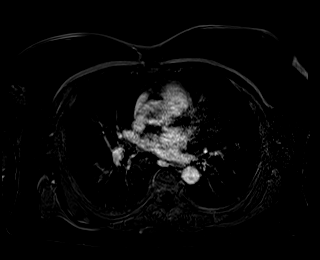

[Series 21: T1 dynamic post-contrast · axial · 3.0mm · 1.19mm/px · z∈[-65,+148]mm · 3 of 72 slices shown (4 of 5)]
[im 1/72]
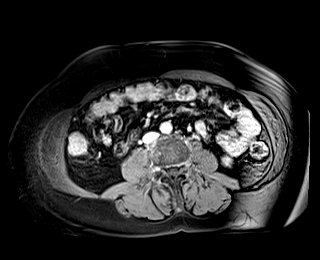
[im 36/72]
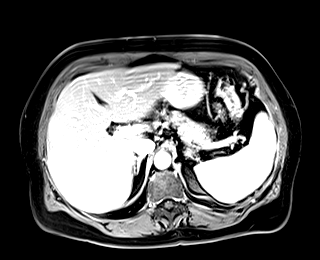
[im 72/72]
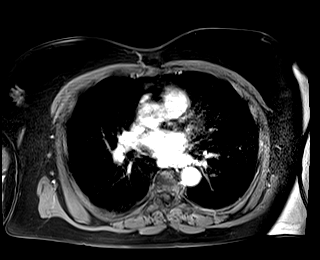

[Series 22: T1 dynamic · axial · 3.0mm · 1.19mm/px · z∈[-65,+148]mm · 3 of 72 slices shown (5 of 5)]
[im 1/72]
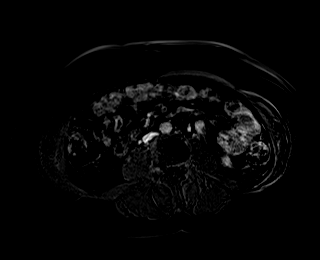
[im 36/72]
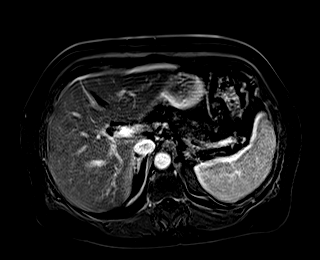
[im 72/72]
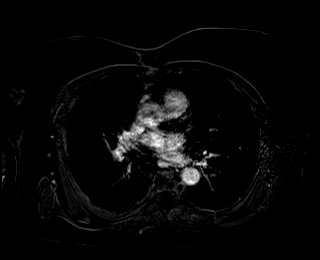

[Series 23: T1 dynamic post-contrast · coronal · 3.0mm · 1.31mm/px · 3 of 72 slices shown (5 of 5)]
[im 1/72]
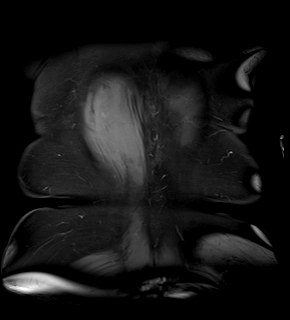
[im 36/72]
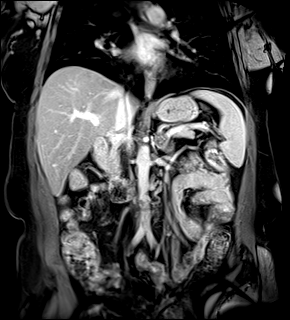
[im 72/72]
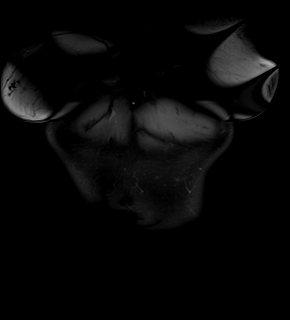

[48 of 48 positions shown; findings below may reference images not displayed]

FINDINGS: Lower chest: No acute findings.

Hepatobiliary: No hepatic masses identified. Prior cholecystectomy.
No evidence of biliary obstruction.

Pancreas:  No mass or inflammatory changes.

Spleen:  Within normal limits in size and appearance.

Adrenals/Urinary Tract: A 1.9 cm left adrenal mass is stable in size
since prior exams. This shows signal dropout on chemical shift
imaging. These findings are consistent with a benign adrenal
adenoma.

Stomach/Bowel: Visualized portion unremarkable.

Vascular/Lymphatic: No pathologically enlarged lymph nodes
identified. No acute vascular findings.

Other:  None.

Musculoskeletal:  No suspicious bone lesions identified.
IMPRESSION: Benign left adrenal adenoma, stable compared prior exams.

## 2022-07-23 ENCOUNTER — Other Ambulatory Visit: Payer: Self-pay | Admitting: Family

## 2022-09-15 DIAGNOSIS — H40033 Anatomical narrow angle, bilateral: Secondary | ICD-10-CM | POA: Diagnosis not present

## 2022-09-15 DIAGNOSIS — H04123 Dry eye syndrome of bilateral lacrimal glands: Secondary | ICD-10-CM | POA: Diagnosis not present

## 2022-10-26 ENCOUNTER — Other Ambulatory Visit: Payer: Self-pay | Admitting: Family

## 2022-10-29 ENCOUNTER — Other Ambulatory Visit: Payer: Self-pay | Admitting: Family

## 2022-10-29 DIAGNOSIS — F3341 Major depressive disorder, recurrent, in partial remission: Secondary | ICD-10-CM

## 2022-11-06 DIAGNOSIS — R808 Other proteinuria: Secondary | ICD-10-CM | POA: Diagnosis not present

## 2022-11-06 DIAGNOSIS — D631 Anemia in chronic kidney disease: Secondary | ICD-10-CM | POA: Diagnosis not present

## 2022-11-06 DIAGNOSIS — N189 Chronic kidney disease, unspecified: Secondary | ICD-10-CM | POA: Diagnosis not present

## 2022-11-20 DIAGNOSIS — N1832 Chronic kidney disease, stage 3b: Secondary | ICD-10-CM | POA: Diagnosis not present

## 2022-11-20 DIAGNOSIS — I5032 Chronic diastolic (congestive) heart failure: Secondary | ICD-10-CM | POA: Diagnosis not present

## 2022-11-20 DIAGNOSIS — K760 Fatty (change of) liver, not elsewhere classified: Secondary | ICD-10-CM | POA: Diagnosis not present

## 2022-11-20 DIAGNOSIS — I129 Hypertensive chronic kidney disease with stage 1 through stage 4 chronic kidney disease, or unspecified chronic kidney disease: Secondary | ICD-10-CM | POA: Diagnosis not present

## 2022-11-24 DIAGNOSIS — H04123 Dry eye syndrome of bilateral lacrimal glands: Secondary | ICD-10-CM | POA: Diagnosis not present

## 2022-11-28 DIAGNOSIS — R92323 Mammographic fibroglandular density, bilateral breasts: Secondary | ICD-10-CM | POA: Diagnosis not present

## 2022-11-28 DIAGNOSIS — Z1231 Encounter for screening mammogram for malignant neoplasm of breast: Secondary | ICD-10-CM | POA: Diagnosis not present

## 2022-11-28 LAB — HM MAMMOGRAPHY

## 2022-12-01 ENCOUNTER — Other Ambulatory Visit: Payer: Self-pay | Admitting: Family

## 2022-12-01 ENCOUNTER — Encounter: Payer: Self-pay | Admitting: Family

## 2022-12-13 DIAGNOSIS — H10013 Acute follicular conjunctivitis, bilateral: Secondary | ICD-10-CM | POA: Diagnosis not present

## 2022-12-23 DIAGNOSIS — H04123 Dry eye syndrome of bilateral lacrimal glands: Secondary | ICD-10-CM | POA: Diagnosis not present

## 2023-01-02 DIAGNOSIS — H00025 Hordeolum internum left lower eyelid: Secondary | ICD-10-CM | POA: Diagnosis not present

## 2023-01-20 ENCOUNTER — Other Ambulatory Visit: Payer: Self-pay | Admitting: Family

## 2023-01-20 DIAGNOSIS — F3341 Major depressive disorder, recurrent, in partial remission: Secondary | ICD-10-CM

## 2023-01-20 DIAGNOSIS — F411 Generalized anxiety disorder: Secondary | ICD-10-CM

## 2023-01-20 DIAGNOSIS — F32A Depression, unspecified: Secondary | ICD-10-CM

## 2023-01-29 DIAGNOSIS — Z23 Encounter for immunization: Secondary | ICD-10-CM | POA: Diagnosis not present

## 2023-04-05 ENCOUNTER — Encounter: Payer: BC Managed Care – PPO | Admitting: Family

## 2023-04-08 ENCOUNTER — Other Ambulatory Visit: Payer: Self-pay | Admitting: Family

## 2023-04-27 ENCOUNTER — Encounter: Payer: BC Managed Care – PPO | Admitting: Family

## 2023-05-06 ENCOUNTER — Other Ambulatory Visit: Payer: Self-pay | Admitting: Family

## 2023-05-06 DIAGNOSIS — F3341 Major depressive disorder, recurrent, in partial remission: Secondary | ICD-10-CM

## 2023-05-06 DIAGNOSIS — F411 Generalized anxiety disorder: Secondary | ICD-10-CM

## 2023-05-06 DIAGNOSIS — F32A Depression, unspecified: Secondary | ICD-10-CM

## 2023-05-22 DIAGNOSIS — E211 Secondary hyperparathyroidism, not elsewhere classified: Secondary | ICD-10-CM | POA: Diagnosis not present

## 2023-05-22 DIAGNOSIS — N189 Chronic kidney disease, unspecified: Secondary | ICD-10-CM | POA: Diagnosis not present

## 2023-05-22 DIAGNOSIS — D631 Anemia in chronic kidney disease: Secondary | ICD-10-CM | POA: Diagnosis not present

## 2023-05-22 DIAGNOSIS — R809 Proteinuria, unspecified: Secondary | ICD-10-CM | POA: Diagnosis not present

## 2023-05-25 DIAGNOSIS — I129 Hypertensive chronic kidney disease with stage 1 through stage 4 chronic kidney disease, or unspecified chronic kidney disease: Secondary | ICD-10-CM | POA: Diagnosis not present

## 2023-05-25 DIAGNOSIS — N1831 Chronic kidney disease, stage 3a: Secondary | ICD-10-CM | POA: Diagnosis not present

## 2023-05-25 DIAGNOSIS — I5032 Chronic diastolic (congestive) heart failure: Secondary | ICD-10-CM | POA: Diagnosis not present

## 2023-05-25 DIAGNOSIS — K760 Fatty (change of) liver, not elsewhere classified: Secondary | ICD-10-CM | POA: Diagnosis not present

## 2023-05-31 ENCOUNTER — Ambulatory Visit: Payer: PPO | Admitting: Family

## 2023-05-31 ENCOUNTER — Encounter: Payer: Self-pay | Admitting: Family

## 2023-05-31 ENCOUNTER — Ambulatory Visit (INDEPENDENT_AMBULATORY_CARE_PROVIDER_SITE_OTHER): Payer: PPO

## 2023-05-31 VITALS — BP 130/80 | HR 69 | Temp 97.4°F | Ht 62.0 in | Wt 143.4 lb

## 2023-05-31 DIAGNOSIS — Z0001 Encounter for general adult medical examination with abnormal findings: Secondary | ICD-10-CM | POA: Diagnosis not present

## 2023-05-31 DIAGNOSIS — Z0000 Encounter for general adult medical examination without abnormal findings: Secondary | ICD-10-CM | POA: Diagnosis not present

## 2023-05-31 DIAGNOSIS — M25552 Pain in left hip: Secondary | ICD-10-CM

## 2023-05-31 DIAGNOSIS — N183 Chronic kidney disease, stage 3 unspecified: Secondary | ICD-10-CM

## 2023-05-31 DIAGNOSIS — Z23 Encounter for immunization: Secondary | ICD-10-CM | POA: Diagnosis not present

## 2023-05-31 DIAGNOSIS — E039 Hypothyroidism, unspecified: Secondary | ICD-10-CM

## 2023-05-31 DIAGNOSIS — F325 Major depressive disorder, single episode, in full remission: Secondary | ICD-10-CM | POA: Diagnosis not present

## 2023-05-31 DIAGNOSIS — K219 Gastro-esophageal reflux disease without esophagitis: Secondary | ICD-10-CM | POA: Diagnosis not present

## 2023-05-31 DIAGNOSIS — Z1211 Encounter for screening for malignant neoplasm of colon: Secondary | ICD-10-CM

## 2023-05-31 DIAGNOSIS — M1612 Unilateral primary osteoarthritis, left hip: Secondary | ICD-10-CM | POA: Diagnosis not present

## 2023-05-31 DIAGNOSIS — F411 Generalized anxiety disorder: Secondary | ICD-10-CM

## 2023-05-31 LAB — LIPID PANEL

## 2023-05-31 MED ORDER — BACLOFEN 10 MG PO TABS: 10.0000 mg | ORAL_TABLET | Freq: Three times a day (TID) | ORAL | 2 refills | Status: DC

## 2023-05-31 MED ORDER — BUPROPION HCL ER (XL) 300 MG PO TB24
300.0000 mg | ORAL_TABLET | Freq: Every day | ORAL | 4 refills | Status: AC
Start: 2023-05-31 — End: ?

## 2023-05-31 MED ORDER — DICLOFENAC SODIUM 75 MG PO TBEC: 75.0000 mg | DELAYED_RELEASE_TABLET | Freq: Two times a day (BID) | ORAL | 2 refills | Status: DC

## 2023-05-31 MED ORDER — DESVENLAFAXINE SUCCINATE ER 100 MG PO TB24: 100.0000 mg | ORAL_TABLET | Freq: Every day | ORAL | 4 refills | Status: AC

## 2023-05-31 MED ORDER — OMEPRAZOLE 40 MG PO CPDR: 40.0000 mg | DELAYED_RELEASE_CAPSULE | Freq: Every day | ORAL | 4 refills | Status: AC

## 2023-05-31 NOTE — Patient Instructions (Signed)
 Hip Pain The hip is the joint between the upper legs and the lower pelvis. The bones, cartilage, tendons, and muscles of your hip joint support your body and allow you to move around. Hip pain can range from a minor ache to severe pain in one or both of your hips. The pain may be felt on the inside of the hip joint near the groin, or on the outside near the buttocks and upper thigh. You may also have swelling or stiffness in your hip area. Follow these instructions at home: Managing pain, stiffness, and swelling     If told, put ice on the painful area. Put ice in a plastic bag. Place a towel between your skin and the bag. Leave the ice on for 20 minutes, 2-3 times a day. If told, apply heat to the affected area as often as told by your health care provider. Use the heat source that your provider recommends, such as a moist heat pack or a heating pad. Place a towel between your skin and the heat source. Leave the heat on for 20-30 minutes. If your skin turns bright red, remove the ice or heat right away to prevent skin damage. The risk of damage is higher if you cannot feel pain, heat, or cold. Activity Do exercises as told by your provider. Avoid activities that cause pain. General instructions  Take over-the-counter and prescription medicines only as told by your provider. Keep a journal of your symptoms. Write down: How often you have hip pain. The location of your pain. What the pain feels like. What makes the pain worse. Sleep with a pillow between your legs on your most comfortable side. Keep all follow-up visits. Your provider will monitor your pain and activity. Contact a health care provider if: You cannot put weight on your leg. Your pain or swelling gets worse after a week. It gets harder to walk. You have a fever. Get help right away if: You fall. You have a sudden increase in pain and swelling in your hip. Your hip is red or swollen or very tender to touch. This  information is not intended to replace advice given to you by your health care provider. Make sure you discuss any questions you have with your health care provider. Document Revised: 11/22/2021 Document Reviewed: 11/22/2021 Elsevier Patient Education  2024 [employer].

## 2023-05-31 NOTE — Progress Notes (Signed)
 Subjective:    Patient ID: Candace Torres, female    DOB: 1956-01-23, 68 y.o.   MRN: [id number]  Chief Complaint  Patient presents with   Annual Exam    No pap   PT presents to the office today for CPE without pap. She is followed by Nephrologists for CKD.   Reports she fell on some ice a month ago. Reports since then she has been having left hip pain on and off. Reports a muscle spasms that will 10 out 10 and last a hour after NSAIDs.   Gastroesophageal Reflux She complains of belching and heartburn. She reports no hoarse voice. This is a chronic problem. The current episode started more than 1 year ago. The problem occurs rarely. The problem has been waxing and waning. Pertinent negatives include no fatigue. She has tried a PPI for the symptoms. The treatment provided moderate relief.  Thyroid Problem Presents for follow-up visit. Symptoms include anxiety. Patient reports no dry skin, fatigue or hoarse voice. The symptoms have been stable.  Anxiety Presents for follow-up visit. Symptoms include excessive worry, irritability and nervous/anxious behavior. Symptoms occur rarely. The severity of symptoms is mild.    Depression        This is a chronic problem.  The current episode started more than 1 year ago.   The problem occurs intermittently.  Associated symptoms include no fatigue, no helplessness, no hopelessness and not sad.  Past treatments include SNRIs - Serotonin and norepinephrine reuptake inhibitors.  Past medical history includes thyroid problem and anxiety.       Review of Systems  Constitutional:  Positive for irritability. Negative for fatigue.  HENT:  Negative for hoarse voice.   Gastrointestinal:  Positive for heartburn.  Psychiatric/Behavioral:  The patient is nervous/anxious.   All other systems reviewed and are negative.      Family History  Problem Relation Age of Onset   Depression Mother    Hypertension Mother    COPD Father    Colon cancer Neg Hx     Colon polyps Neg Hx    Social History   Socioeconomic History   Marital status: Married    Spouse name: Not on file   Number of children: Not on file   Years of education: Not on file   Highest education level: Not on file  Occupational History   Not on file  Tobacco Use   Smoking status: Former   Smokeless tobacco: Never   Tobacco comments:    Quit 10 years.    Vaping Use   Vaping status: Never Used  Substance and Sexual Activity   Alcohol use: No   Drug use: No   Sexual activity: Not on file  Other Topics Concern   Not on file  Social History Narrative   Lives at home with husband.  One son and three grands.     Social Drivers of Corporate investment banker Strain: Not on file  Food Insecurity: Not on file  Transportation Needs: Not on file  Physical Activity: Not on file  Stress: Not on file  Social Connections: Unknown (08/07/2021)   Received from [facility]   Social Network    Social Network: Not on file    Objective:   Physical Exam Vitals reviewed.  Constitutional:      General: She is not in acute distress.    Appearance: She is well-developed.  HENT:     Head: Normocephalic and atraumatic.  Right Ear: Tympanic membrane normal.     Left Ear: Tympanic membrane normal.  Eyes:     Pupils: Pupils are equal, round, and reactive to light.  Neck:     Thyroid: No thyromegaly.  Cardiovascular:     Rate and Rhythm: Normal rate and regular rhythm.     Heart sounds: Normal heart sounds. No murmur heard. Pulmonary:     Effort: Pulmonary effort is normal. No respiratory distress.     Breath sounds: Normal breath sounds. No wheezing.  Abdominal:     General: Bowel sounds are normal. There is no distension.     Palpations: Abdomen is soft.     Tenderness: There is no abdominal tenderness.  Musculoskeletal:        General: No tenderness. Normal range of motion.     Cervical back: Normal range of motion and neck supple.     Comments:  Full ROM of left hip  Skin:    General: Skin is warm and dry.  Neurological:     Mental Status: She is alert and oriented to person, place, and time.     Cranial Nerves: No cranial nerve deficit.     Deep Tendon Reflexes: Reflexes are normal and symmetric.  Psychiatric:        Behavior: Behavior normal.        Thought Content: Thought content normal.        Judgment: Judgment normal.       BP 130/80   Pulse 69   Temp (!) 97.4 F (36.3 C) (Temporal)   Ht 5\' 2"  (1.575 m)   Wt 143 lb 6.4 oz (65 kg)   SpO2 98%   BMI 26.23 kg/m        Assessment & Plan:  Candace Torres comes in today with chief complaint of Annual Exam (No pap)   Diagnosis and orders addressed:  1. Need for shingles vaccine - Zoster, Recombinant (Shingrix) - CMP14+EGFR - CBC with Differential/Platelet  2. Annual physical exam (Primary) - Cologuard - CMP14+EGFR - CBC with Differential/Platelet - Lipid panel  3. Hypothyroidism, unspecified type - CMP14+EGFR - CBC with Differential/Platelet - TSH  4. Gastroesophageal reflux disease, unspecified whether esophagitis present - CMP14+EGFR - CBC with Differential/Platelet  5. GAD (generalized anxiety disorder) - CMP14+EGFR - CBC with Differential/Platelet  6. Depression, unspecified depression type  - CMP14+EGFR - CBC with Differential/Platelet  7. Stage 3 chronic kidney disease, unspecified whether stage 3a or 3b CKD (HCC) - CMP14+EGFR - CBC with Differential/Platelet  8. Left hip pain Start diclofenac BID with food, no other NSAID's  Baclofen as needed, sedation precautions discussed  - CMP14+EGFR - CBC with Differential/Platelet - DG HIP UNILAT W OR W/O PELVIS 2-3 VIEWS LEFT; Future - baclofen (LIORESAL) 10 MG tablet; Take 1 tablet (10 mg total) by mouth 3 (three) times daily.  Dispense: 60 each; Refill: 2  9. Colon cancer screening - Cologuard - CMP14+EGFR - CBC with Differential/Platelet   Labs pending Continue current  medications  Health Maintenance reviewed Diet and exercise encouraged  Follow up plan: 6 months   Jannifer Rodney, FNP

## 2023-06-01 LAB — CMP14+EGFR
ALT: 17 IU/L (ref 0–32)
AST: 22 IU/L (ref 0–40)
Albumin: 4.6 g/dL (ref 3.9–4.9)
Alkaline Phosphatase: 86 IU/L (ref 44–121)
BUN/Creatinine Ratio: 15 (ref 12–28)
BUN: 19 mg/dL (ref 8–27)
Bilirubin Total: 0.4 mg/dL (ref 0.0–1.2)
CO2: 22 mmol/L (ref 20–29)
Calcium: 9.8 mg/dL (ref 8.7–10.3)
Chloride: 104 mmol/L (ref 96–106)
Creatinine, Ser: 1.27 mg/dL — ABNORMAL HIGH (ref 0.57–1.00)
Globulin, Total: 2.8 g/dL (ref 1.5–4.5)
Glucose: 98 mg/dL (ref 70–99)
Potassium: 4.6 mmol/L (ref 3.5–5.2)
Sodium: 140 mmol/L (ref 134–144)
Total Protein: 7.4 g/dL (ref 6.0–8.5)
eGFR: 46 mL/min/{1.73_m2} — ABNORMAL LOW (ref >59)

## 2023-06-01 LAB — CBC WITH DIFFERENTIAL/PLATELET
Basophils Absolute: 0.1 10*3/uL (ref 0.0–0.2)
Basos: 1 %
EOS (ABSOLUTE): 0.3 10*3/uL (ref 0.0–0.4)
Eos: 4 %
Hematocrit: 40.9 % (ref 34.0–46.6)
Hemoglobin: 13.9 g/dL (ref 11.1–15.9)
Immature Grans (Abs): 0.1 10*3/uL (ref 0.0–0.1)
Immature Granulocytes: 1 %
Lymphocytes Absolute: 2 10*3/uL (ref 0.7–3.1)
Lymphs: 25 %
MCH: 29.8 pg (ref 26.6–33.0)
MCHC: 34 g/dL (ref 31.5–35.7)
MCV: 88 fL (ref 79–97)
Monocytes Absolute: 0.6 10*3/uL (ref 0.1–0.9)
Monocytes: 7 %
Neutrophils Absolute: 5.1 10*3/uL (ref 1.4–7.0)
Neutrophils: 62 %
Platelets: 245 10*3/uL (ref 150–450)
RBC: 4.66 x10E6/uL (ref 3.77–5.28)
RDW: 12.2 % (ref 11.7–15.4)
WBC: 8.3 10*3/uL (ref 3.4–10.8)

## 2023-06-01 LAB — LIPID PANEL
Cholesterol, Total: 185 mg/dL (ref 100–199)
HDL: 44 mg/dL (ref >39)
LDL CALC COMMENT:: 4.2 ratio (ref 0.0–4.4)
LDL Chol Calc (NIH): 120 mg/dL — ABNORMAL HIGH (ref 0–99)
Triglycerides: 116 mg/dL (ref 0–149)
VLDL Cholesterol Cal: 21 mg/dL (ref 5–40)

## 2023-06-01 LAB — TSH: TSH: 2.24 u[IU]/mL (ref 0.450–4.500)

## 2023-06-08 ENCOUNTER — Telehealth: Payer: Self-pay

## 2023-06-08 NOTE — Telephone Encounter (Signed)
Patient aware that results are not back yet. 

## 2023-06-08 NOTE — Telephone Encounter (Signed)
 Copied from CRM [id number]. Topic: Clinical - Lab/Test Results >> Jun 08, 2023 10:08 AM Shelah Lewandowsky wrote: Reason for CRM: calling for  xray results, [phone]

## 2023-06-13 DIAGNOSIS — Z1211 Encounter for screening for malignant neoplasm of colon: Secondary | ICD-10-CM | POA: Diagnosis not present

## 2023-06-20 ENCOUNTER — Encounter: Payer: Self-pay | Admitting: Family Medicine

## 2023-06-20 LAB — COLOGUARD: COLOGUARD: NEGATIVE

## 2023-07-30 ENCOUNTER — Ambulatory Visit (INDEPENDENT_AMBULATORY_CARE_PROVIDER_SITE_OTHER): Payer: PPO

## 2023-07-30 DIAGNOSIS — Z23 Encounter for immunization: Secondary | ICD-10-CM

## 2023-07-30 NOTE — Progress Notes (Signed)
 Patient is in office today for a nurse visit for [employer]. Patient Injection was given in the  Right deltoid. Patient tolerated injection well.

## 2023-08-09 ENCOUNTER — Ambulatory Visit (INDEPENDENT_AMBULATORY_CARE_PROVIDER_SITE_OTHER): Admitting: Family Medicine

## 2023-08-09 ENCOUNTER — Encounter: Payer: Self-pay | Admitting: Family Medicine

## 2023-08-09 VITALS — BP 121/70 | HR 88 | Temp 98.6°F | Ht 62.0 in | Wt 148.2 lb

## 2023-08-09 DIAGNOSIS — J0100 Acute maxillary sinusitis, unspecified: Secondary | ICD-10-CM | POA: Diagnosis not present

## 2023-08-09 MED ORDER — AMOXICILLIN 875 MG PO TABS: 875.0000 mg | ORAL_TABLET | Freq: Two times a day (BID) | ORAL | 0 refills | Status: AC

## 2023-08-09 MED ORDER — BENZONATATE 100 MG PO CAPS: 100.0000 mg | ORAL_CAPSULE | Freq: Two times a day (BID) | ORAL | 0 refills | Status: DC | PRN

## 2023-08-09 NOTE — Progress Notes (Signed)
 Acute Office Visit  Subjective:     Patient ID: Candace Torres, female    DOB: 1956-01-08, 68 y.o.   MRN: [id number]  Chief Complaint  Patient presents with   Cough    Cough This is a new problem. Episode onset: 7 days. The problem has been unchanged. The cough is Non-productive. Associated symptoms include headaches (sinus pressure), nasal congestion, rhinorrhea, a sore throat and shortness of breath (with cough). Pertinent negatives include no chest pain, chills, ear congestion, ear pain, fever, myalgias or wheezing. She has tried OTC cough suppressant (mucinex) for the symptoms. The treatment provided mild relief. There is no history of asthma, bronchitis, COPD or pneumonia.    Review of Systems  Constitutional:  Negative for chills and fever.  HENT:  Positive for rhinorrhea and sore throat. Negative for ear pain.   Respiratory:  Positive for cough and shortness of breath (with cough). Negative for wheezing.   Cardiovascular:  Negative for chest pain.  Musculoskeletal:  Negative for myalgias.  Neurological:  Positive for headaches (sinus pressure).        Objective:    BP 121/70   Pulse 88   Temp 98.6 F (37 C) (Temporal)   Ht 5\' 2"  (1.575 m)   Wt 148 lb 3.2 oz (67.2 kg)   SpO2 94%   BMI 27.11 kg/m    Physical Exam Vitals and nursing note reviewed.  Constitutional:      General: She is not in acute distress.    Appearance: She is not ill-appearing, toxic-appearing or diaphoretic.  HENT:     Head: Normocephalic and atraumatic.     Right Ear: Tympanic membrane, ear canal and external ear normal.     Left Ear: Tympanic membrane, ear canal and external ear normal.     Nose: Congestion present.     Right Sinus: Maxillary sinus tenderness present. No frontal sinus tenderness.     Left Sinus: Maxillary sinus tenderness present. No frontal sinus tenderness.     Mouth/Throat:     Mouth: Mucous membranes are moist.     Pharynx: Oropharynx is clear. No  oropharyngeal exudate or posterior oropharyngeal erythema.     Tonsils: No tonsillar exudate or tonsillar abscesses. 0 on the right. 0 on the left.  Eyes:     General:        Right eye: No discharge.        Left eye: No discharge.     Conjunctiva/sclera: Conjunctivae normal.  Cardiovascular:     Rate and Rhythm: Normal rate and regular rhythm.     Heart sounds: Normal heart sounds. No murmur heard. Pulmonary:     Effort: Pulmonary effort is normal.     Breath sounds: Normal breath sounds.  Musculoskeletal:     Cervical back: Neck supple. No rigidity.     Right lower leg: No edema.     Left lower leg: No edema.  Skin:    General: Skin is warm and dry.  Neurological:     General: No focal deficit present.     Mental Status: She is alert and oriented to person, place, and time.  Psychiatric:        Mood and Affect: Mood normal.        Behavior: Behavior normal.     No results found for any visits on 08/09/23.      Assessment & Plan:   Jamal was seen today for cough.  Diagnoses and all orders for this visit:  Acute  non-recurrent maxillary sinusitis Amoxicillin as below. Tessalon  perles prn. Discussed symptomatic care and return precautions.  -     amoxicillin (AMOXIL) 875 MG tablet; Take 1 tablet (875 mg total) by mouth 2 (two) times daily for 7 days. -     benzonatate  (TESSALON ) 100 MG capsule; Take 1 capsule (100 mg total) by mouth 2 (two) times daily as needed for cough.  The patient indicates understanding of these issues and agrees with the plan.  Albertha Huger, FNP

## 2023-08-28 ENCOUNTER — Encounter: Payer: Self-pay | Admitting: Family

## 2023-08-28 ENCOUNTER — Ambulatory Visit (INDEPENDENT_AMBULATORY_CARE_PROVIDER_SITE_OTHER): Admitting: Family

## 2023-08-28 VITALS — BP 124/81 | HR 72 | Temp 97.8°F | Ht 62.0 in | Wt 150.8 lb

## 2023-08-28 DIAGNOSIS — Z0000 Encounter for general adult medical examination without abnormal findings: Secondary | ICD-10-CM

## 2023-08-28 NOTE — Progress Notes (Signed)
 Subjective:    Candace Torres is a 68 y.o. female who presents for a Welcome to Medicare exam.   Cardiac Risk Factors include: advanced age (>12men, >32 women)      Objective:     Today's Vitals   08/28/23 1016  BP: 124/81  Pulse: 72  Temp: 97.8 F (36.6 C)  TempSrc: Temporal  SpO2: 96%  Weight: 150 lb 12.8 oz (68.4 kg)  Height: 5\' 2"  (1.575 m)  Body mass index is 27.58 kg/m.  Medications Outpatient Encounter Medications as of 08/28/2023  Medication Sig   baclofen  (LIORESAL ) 10 MG tablet Take 1 tablet (10 mg total) by mouth 3 (three) times daily.   buPROPion  (WELLBUTRIN  XL) 300 MG 24 hr tablet Take 1 tablet (300 mg total) by mouth daily.   desvenlafaxine  (PRISTIQ ) 100 MG 24 hr tablet Take 1 tablet (100 mg total) by mouth daily.   diclofenac  (VOLTAREN ) 75 MG EC tablet Take 1 tablet (75 mg total) by mouth 2 (two) times daily.   hydrOXYzine  (ATARAX ) 25 MG tablet Take 12.5 mg by mouth every 8 (eight) hours as needed.   levothyroxine  (SYNTHROID ) 88 MCG tablet Take 1 tablet (88 mcg total) by mouth daily.   omeprazole  (PRILOSEC) 40 MG capsule Take 1 capsule (40 mg total) by mouth daily.   rosuvastatin  (CRESTOR ) 5 MG tablet Take 1 tablet (5 mg total) by mouth daily. (NEEDS TO BE SEEN BEFORE NEXT REFILL) (Patient not taking: Reported on 08/28/2023)   [DISCONTINUED] benzonatate  (TESSALON ) 100 MG capsule Take 1 capsule (100 mg total) by mouth 2 (two) times daily as needed for cough.   No facility-administered encounter medications on file as of 08/28/2023.     History: Past Medical History:  Diagnosis Date   Depression    GERD (gastroesophageal reflux disease)    Hypothyroidism    Past Surgical History:  Procedure Laterality Date   CHOLECYSTECTOMY      Family History  Problem Relation Age of Onset   Depression Mother    Hypertension Mother    COPD Father    Colon cancer Neg Hx    Colon polyps Neg Hx    Social History   Occupational History   Not on file   Tobacco Use   Smoking status: Former   Smokeless tobacco: Never   Tobacco comments:    Quit 10 years.    Vaping Use   Vaping status: Never Used  Substance and Sexual Activity   Alcohol use: No   Drug use: No   Sexual activity: Not on file    Tobacco Counseling Counseling given: Not Answered Tobacco comments: Quit 10 years.     Immunizations and Health Maintenance Immunization History  Administered Date(s) Administered   Fluad Quad(high Dose 65+) 03/30/2022   Influenza Inj Mdck Quad Pf 01/31/2023   Influenza, Mdck, Trivalent,PF 6+ MOS(egg free) 01/29/2023   Influenza-Unspecified 01/05/2021   Moderna SARS-COV2 Booster Vaccination 04/16/2020   Moderna Sars-Covid-2 Vaccination 03/19/2020   PNEUMOCOCCAL CONJUGATE-20 03/22/2021   Pneumococcal Conjugate-13 01/01/2020   Tdap 01/08/2014   Zoster Recombinant(Shingrix ) 05/31/2023, 07/30/2023   Zoster, Live 11/12/2012   Health Maintenance Due  Topic Date Due   Medicare Annual Wellness (AWV)  Never done    Activities of Daily Living    08/28/2023   10:05 AM  In your present state of health, do you have any difficulty performing the following activities:  Hearing? 0  Vision? 0  Difficulty concentrating or making decisions? 0  Walking or climbing stairs? 0  Dressing or bathing? 0  Doing errands, shopping? 0  Preparing Food and eating ? N  Using the Toilet? N  In the past six months, have you accidently leaked urine? Y  Do you have problems with loss of bowel control? N  Managing your Medications? N  Managing your Finances? N  Housekeeping or managing your Housekeeping? N    Physical Exam   Physical Exam (optional), or other factors deemed appropriate based on the beneficiary's medical and social history and current clinical standards.   Advanced Directives: Does Patient Have a Medical Advance Directive?: No Would patient like information on creating a medical advance directive?: No - Patient declined  EKG:  normal  EKG, normal sinus rhythm      Assessment:     This is a routine wellness examination for this patient . No complaints today.  Vision/Hearing screen No results found.   Goals      DIET - INCREASE WATER INTAKE        Depression Screen    08/28/2023   10:13 AM 08/09/2023   11:05 AM 05/31/2023   10:10 AM 03/30/2022    9:08 AM  PHQ 2/9 Scores  PHQ - 2 Score 1 2 1 2   PHQ- 9 Score 3 7 3 4      Fall Risk    08/28/2023   10:12 AM  Fall Risk   Falls in the past year? 0  Number falls in past yr: 0  Injury with Fall? 0  Risk for fall due to : No Fall Risks  Follow up Falls evaluation completed;Education provided    Cognitive Function:        08/28/2023   10:13 AM  6CIT Screen  What Year? 0 points  What month? 0 points  What time? 0 points  Count back from 20 0 points  Repeat phrase 2 points    Patient Care Team: Yevette Hem, FNP as PCP - General (Family Medicine) Vinetta Greening, DO as Consulting Physician (Internal Medicine)     Plan:     I have personally reviewed and noted the following in the patient's chart:   Medical and social history Use of alcohol, tobacco or illicit drugs  Current medications and supplements including opioid prescriptions. Patient is not currently taking opioid prescriptions. Functional ability and status Nutritional status Physical activity Advanced directives List of other physicians Hospitalizations, surgeries, and ER visits in previous 12 months Vitals Screenings to include cognitive, depression, and falls Referrals and appointments  In addition, I have reviewed and discussed with patient certain preventive protocols, quality metrics, and best practice recommendations. A written personalized care plan for preventive services as well as general preventive health recommendations were provided to patient.     Tommas Fragmin, [place] 08/28/2023

## 2023-08-28 NOTE — Patient Instructions (Addendum)
 Dyslipidemia Dyslipidemia is an imbalance of waxy, fat-like substances (lipids) in the blood. The body needs lipids in small amounts. Dyslipidemia often involves a high level of cholesterol or triglycerides, which are types of lipids. Common forms of dyslipidemia include: High levels of LDL cholesterol. LDL is the type of cholesterol that causes fatty deposits (plaques) to build up in the blood vessels that carry blood away from the heart (arteries). Low levels of HDL cholesterol. HDL cholesterol is the type of cholesterol that protects against heart disease. High levels of HDL remove the LDL buildup from arteries. High levels of triglycerides. Triglycerides are a fatty substance in the blood that is linked to a buildup of plaques in the arteries. What are the causes? There are two main types of dyslipidemia: primary and secondary. Primary dyslipidemia is caused by changes (mutations) in genes that are passed down through families (inherited). These mutations cause several types of dyslipidemia. Secondary dyslipidemia may be caused by various risk factors that can lead to the disease, such as lifestyle choices and certain medical conditions. What increases the risk? You are more likely to develop this condition if you are an older man or if you are a woman who has gone through menopause. Other risk factors include: Having a family history of dyslipidemia. Taking certain medicines, including birth control pills, steroids, some diuretics, and beta-blockers. Eating a diet high in saturated fat. Smoking cigarettes or excessive alcohol intake. Having certain medical conditions such as diabetes, polycystic ovary syndrome (PCOS), kidney disease, liver disease, or hypothyroidism. Not exercising regularly. Being overweight or obese with too much belly fat. What are the signs or symptoms? In most cases, dyslipidemia does not usually cause any symptoms. In severe cases, very high lipid levels can  cause: Fatty bumps under the skin (xanthomas). A white or gray ring around the black center (pupil) of the eye. Very high triglyceride levels can cause inflammation of the pancreas (pancreatitis). How is this diagnosed? Your health care provider may diagnose dyslipidemia based on a routine blood test (fasting blood test). Because most people do not have symptoms of the condition, this blood testing (lipid profile) is done on adults age 70 and older and is repeated every 4-6 years. This test checks: Total cholesterol. This measures the total amount of cholesterol in your blood, including LDL cholesterol, HDL cholesterol, and triglycerides. A healthy number is below 200 mg/dL (4.01 mmol/L). LDL cholesterol. The target number for LDL cholesterol is different for each person, depending on individual risk factors. A healthy number is usually below 100 mg/dL (0.27 mmol/L). Ask your health care provider what your LDL cholesterol should be. HDL cholesterol. An HDL level of 60 mg/dL (2.53 mmol/L) or higher is best because it helps to protect against heart disease. A number below 40 mg/dL (6.64 mmol/L) for men or below 50 mg/dL (4.03 mmol/L) for women increases the risk for heart disease. Triglycerides. A healthy triglyceride number is below 150 mg/dL (4.74 mmol/L). If your lipid profile is abnormal, your health care provider may do other blood tests. How is this treated? Treatment depends on the type of dyslipidemia that you have and your other risk factors for heart disease and stroke. Your health care provider will have a target range for your lipid levels based on this information. Treatment for dyslipidemia starts with lifestyle changes, such as diet and exercise. Your health care provider may recommend that you: Get regular exercise. Make changes to your diet. Quit smoking if you smoke. Limit your alcohol intake. If diet  changes and exercise do not help you reach your goals, your health care provider  may also prescribe medicine to lower lipids. The most commonly prescribed type of medicine lowers your LDL cholesterol (statin drug). If you have a high triglyceride level, your provider may prescribe another type of drug (fibrate) or an omega-3 fish oil supplement, or both. Follow these instructions at home: Eating and drinking  Follow instructions from your health care provider or dietitian about eating or drinking restrictions. Eat a healthy diet as told by your health care provider. This can help you reach and maintain a healthy weight, lower your LDL cholesterol, and raise your HDL cholesterol. This may include: Limiting your calories, if you are overweight. Eating more fruits, vegetables, whole grains, fish, and lean meats. Limiting saturated fat, trans fat, and cholesterol. Do not drink alcohol if: Your health care provider tells you not to drink. You are pregnant, may be pregnant, or are planning to become pregnant. If you drink alcohol: Limit how much you have to: 0-1 drink a day for women. 0-2 drinks a day for men. Know how much alcohol is in your drink. In the [place] one drink equals one 12 oz bottle of beer (355 mL), one 5 oz glass of wine (148 mL), or one 1 oz glass of hard liquor (44 mL). Activity Get regular exercise. Start an exercise and strength training program as told by your health care provider. Ask your health care provider what activities are safe for you. Your health care provider may recommend: 30 minutes of aerobic activity 4-6 days a week. Brisk walking is an example of aerobic activity. Strength training 2 days a week. General instructions Do not use any products that contain nicotine or tobacco. These products include cigarettes, chewing tobacco, and vaping devices, such as e-cigarettes. If you need help quitting, ask your health care provider. Take over-the-counter and prescription medicines only as told by your health care provider. This includes  supplements. Keep all follow-up visits. This is important. Contact a health care provider if: You are having trouble sticking to your exercise or diet plan. You are struggling to quit smoking or to control your use of alcohol. Summary Dyslipidemia often involves a high level of cholesterol or triglycerides, which are types of lipids. Treatment depends on the type of dyslipidemia that you have and your other risk factors for heart disease and stroke. Treatment for dyslipidemia starts with lifestyle changes, such as diet and exercise. Your health care provider may prescribe medicine to lower lipids. This information is not intended to replace advice given to you by your health care provider. Make sure you discuss any questions you have with your health care provider.  Health Maintenance After Age 14 After age 43, you are at a higher risk for certain long-term diseases and infections as well as injuries from falls. Falls are a major cause of broken bones and head injuries in people who are older than age 60. Getting regular preventive care can help to keep you healthy and well. Preventive care includes getting regular testing and making lifestyle changes as recommended by your health care provider. Talk with your health care provider about: Which screenings and tests you should have. A screening is a test that checks for a disease when you have no symptoms. A diet and exercise plan that is right for you. What should I know about screenings and tests to prevent falls? Screening and testing are the best ways to find a health problem early. Early diagnosis  and treatment give you the best chance of managing medical conditions that are common after age 34. Certain conditions and lifestyle choices may make you more likely to have a fall. Your health care provider may recommend: Regular vision checks. Poor vision and conditions such as cataracts can make you more likely to have a fall. If you wear glasses,  make sure to get your prescription updated if your vision changes. Medicine review. Work with your health care provider to regularly review all of the medicines you are taking, including over-the-counter medicines. Ask your health care provider about any side effects that may make you more likely to have a fall. Tell your health care provider if any medicines that you take make you feel dizzy or sleepy. Strength and balance checks. Your health care provider may recommend certain tests to check your strength and balance while standing, walking, or changing positions. Foot health exam. Foot pain and numbness, as well as not wearing proper footwear, can make you more likely to have a fall. Screenings, including: Osteoporosis screening. Osteoporosis is a condition that causes the bones to get weaker and break more easily. Blood pressure screening. Blood pressure changes and medicines to control blood pressure can make you feel dizzy. Depression screening. You may be more likely to have a fall if you have a fear of falling, feel depressed, or feel unable to do activities that you used to do. Alcohol use screening. Using too much alcohol can affect your balance and may make you more likely to have a fall. Follow these instructions at home: Lifestyle Do not drink alcohol if: Your health care provider tells you not to drink. If you drink alcohol: Limit how much you have to: 0-1 drink a day for women. 0-2 drinks a day for men. Know how much alcohol is in your drink. In the [place] one drink equals one 12 oz bottle of beer (355 mL), one 5 oz glass of wine (148 mL), or one 1 oz glass of hard liquor (44 mL). Do not use any products that contain nicotine or tobacco. These products include cigarettes, chewing tobacco, and vaping devices, such as e-cigarettes. If you need help quitting, ask your health care provider. Activity  Follow a regular exercise program to stay fit. This will help you maintain your  balance. Ask your health care provider what types of exercise are appropriate for you. If you need a cane or walker, use it as recommended by your health care provider. Wear supportive shoes that have nonskid soles. Safety  Remove any tripping hazards, such as rugs, cords, and clutter. Install safety equipment such as grab bars in bathrooms and safety rails on stairs. Keep rooms and walkways well-lit. General instructions Talk with your health care provider about your risks for falling. Tell your health care provider if: You fall. Be sure to tell your health care provider about all falls, even ones that seem minor. You feel dizzy, tiredness (fatigue), or off-balance. Take over-the-counter and prescription medicines only as told by your health care provider. These include supplements. Eat a healthy diet and maintain a healthy weight. A healthy diet includes low-fat dairy products, low-fat (lean) meats, and fiber from whole grains, beans, and lots of fruits and vegetables. Stay current with your vaccines. Schedule regular health, dental, and eye exams. Summary Having a healthy lifestyle and getting preventive care can help to protect your health and wellness after age 11. Screening and testing are the best way to find a health problem early and  help you avoid having a fall. Early diagnosis and treatment give you the best chance for managing medical conditions that are more common for people who are older than age 68. Falls are a major cause of broken bones and head injuries in people who are older than age 36. Take precautions to prevent a fall at home. Work with your health care provider to learn what changes you can make to improve your health and wellness and to prevent falls. This information is not intended to replace advice given to you by your health care provider. Make sure you discuss any questions you have with your health care provider. Document Revised: 08/09/2020 Document Reviewed:  08/09/2020 Elsevier Patient Education  2024 [employer] Document Revised: 10/21/2021 Document Reviewed: 05/24/2020 Elsevier Patient Education  2024 [employer].

## 2023-10-12 ENCOUNTER — Other Ambulatory Visit: Payer: Self-pay | Admitting: *Deleted

## 2023-10-12 MED ORDER — LEVOTHYROXINE SODIUM 88 MCG PO TABS: 88.0000 ug | ORAL_TABLET | Freq: Every day | ORAL | 2 refills | Status: AC

## 2023-11-01 ENCOUNTER — Ambulatory Visit: Payer: Self-pay

## 2023-11-01 NOTE — Telephone Encounter (Signed)
 Copied from CRM #[id number]. Topic: Clinical - Red Word Triage >> Nov 01, 2023 10:31 AM Willma SAUNDERS wrote: Red Word that prompted transfer to Nurse Triage: Patient was at the beach and things she may have been stung by something. States since yesterday her face, under eye has been swollen.

## 2023-11-01 NOTE — Telephone Encounter (Signed)
 Apt scheduled.

## 2023-11-01 NOTE — Telephone Encounter (Signed)
 FYI Only or Action Required?: FYI only for provider.  Patient was last seen in primary care on 08/28/2023 by Lavell Bari LABOR, FNP.  Called Nurse Triage reporting Facial Swelling.  Symptoms began several days ago.  Interventions attempted: OTC medications: Benadryl cream and Loratadine.  Symptoms are: gradually worsening.  Triage Disposition: See PCP When Office is Open (Within 3 Days)  Patient/caregiver understands and will follow disposition?: Yes Copied from CRM #[id number]. Topic: Clinical - Red Word Triage >> Nov 01, 2023 10:35 AM Wess RAMAN wrote: Red Word that prompted transfer to Nurse Triage: right side of face swelling and very itchy. Believes she got bit by something at the beac Reason for Disposition  [1] MILD face swelling (e.g., puffiness) AND [2] persists > 3 days  Answer Assessment - Initial Assessment Questions 1. ONSET: When did the swelling start? (e.g., minutes, hours, days)     First noticed itchy on Saturday after spending time at beach all last week  2. LOCATION: What part of the face is swollen? (e.g., cheek, entire face, jaw joint area, under jaw)     Right side of face under eye  3. SEVERITY: How swollen is it?     Puffy  4. ITCHING: Is there any itching? If Yes, ask: How much?   (Scale 1-10; mild, moderate or severe)     Moderate itching  5. PAIN: Is the swelling painful to touch? If Yes, ask: How painful is it?   (Scale 0-10; mild, moderate or severe)     No pain  6. FEVER: Do you have a fever? If Yes, ask: What is it, how was it measured, and when did it start?      No  7. CAUSE: What do you think is causing the face swelling?     Thinks she may have gotten bitten by something  8. NEW MEDICINES: Have there been any new medicines started recently?     No new medication  9. RECURRENT SYMPTOM: Have you had face swelling before? If Yes, ask: When was the last time? What happened that time?     No  10. OTHER SYMPTOMS:  Do you have any other symptoms? (e.g., leg swelling, toothache)       No 11. PREGNANCY: Is there any chance you are pregnant? When was your last menstrual period?       No  Protocols used: Face Swelling-A-AH

## 2023-11-02 ENCOUNTER — Encounter: Payer: Self-pay | Admitting: Nurse Practitioner

## 2023-11-02 ENCOUNTER — Ambulatory Visit: Admitting: Nurse Practitioner

## 2023-11-02 VITALS — BP 112/67 | HR 92 | Temp 98.4°F | Ht 62.0 in | Wt 151.0 lb

## 2023-11-02 DIAGNOSIS — H02842 Edema of right lower eyelid: Secondary | ICD-10-CM | POA: Diagnosis not present

## 2023-11-02 MED ORDER — DOXYCYCLINE HYCLATE 100 MG PO TABS: 100.0000 mg | ORAL_TABLET | Freq: Two times a day (BID) | ORAL | 0 refills | Status: DC

## 2023-11-02 MED ORDER — PREDNISONE 20 MG PO TABS: 40.0000 mg | ORAL_TABLET | Freq: Every day | ORAL | 0 refills | Status: AC

## 2023-11-02 NOTE — Patient Instructions (Signed)
Blepharitis Blepharitis is swelling of the eyelids. It can cause the eyes to feel dry or gritty. Other symptoms may include: Reddish, scaly skin around the scalp and eyebrows. Eyelids that itch or burn. Fluid that leaks from the eye at night. This causes the eyelashes to stick together in the morning. Eyelashes that fall out. Redness of the eyes. Eyes that are sensitive to light. Follow these instructions at home: Watch for any changes in how your eyes look or feel. Tell your doctor about any changes. Follow these instructions to help with your condition. Keeping clean Wash your hands often with soap and water for at least 20 seconds. Clean your eyes. Wash the edges of your eyelids using eyelid wipes or a small amount of baby shampoo that has been mixed with warm water (diluted). Do this 2 or more times a day. Wash your face and eyebrows at least once a day. Use a clean towel each time you dry your eyelids. Do not use the towel to clean or dry other areas of your body. Do not share your towel with anyone. General instructions Avoid wearing makeup until you get better. Do not share makeup with anyone. Avoid rubbing your eyes. Use a warm compress on your eyes for 5-10 minutes at a time. Do this 1 or 2 times a day, or as told by your doctor. You can use: A towel with warm water on it. A heating pad that can be warmed in the microwave. The pad should be very warm but not hot enough to burn the skin. If you were given an antibiotic cream or eye drops, use the medicine as told by your doctor. Do not stop using the medicine even if you feel better. Keep all follow-up visits. Contact a doctor if: Your eyelids feel hot. You have blisters on your eyelids. You have a rash on your eyelids. The swelling does not go away in 2-4 days. The swelling gets worse. Get help right away if: You have pain that gets worse or spreads to other parts of your face. You have redness that gets worse or spreads to  other parts of your face. You have changes in how you see (vision). You have pain when you look at lights or things that move. You have a fever. Summary Blepharitis is swelling of the eyelids. Watch for any changes in how your eyes look or feel. Tell your doctor about any changes. Follow home care instructions as told by your doctor. Wash your hands often with soap and water for at least 20 seconds. Avoid wearing makeup. Do not rub your eyes. Use a warm compress, creams, or eye drops as told by your doctor. Let your doctor know if you have changes in how you see, blisters or a rash on your eyelids, or other problems. This information is not intended to replace advice given to you by your health care provider. Make sure you discuss any questions you have with your health care provider. Document Revised: 04/21/2020 Document Reviewed: 04/21/2020 Elsevier Patient Education  2024 [employer].

## 2023-11-02 NOTE — Progress Notes (Signed)
 Subjective:    Patient ID: Candace Torres, female    DOB: 01-29-1956, 68 y.o.   MRN: [id number]   Chief Complaint: Right eye swollen and red   HPI  Patient said she was at the beach all [place] week and eye swool up one morning the got better. Started swelling again 2 days ago. Very itchy. No discharge or visual disturbances. 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  Eyes:  Positive for redness (outside). Negative for photophobia, pain, discharge, itching and visual disturbance.  Respiratory:  Negative for shortness of breath.   Cardiovascular:  Negative for chest pain, palpitations and leg swelling.  Gastrointestinal:  Negative for abdominal pain.  Endocrine: Negative for polydipsia.  Skin:  Negative for rash.  Neurological:  Negative for dizziness, weakness and headaches.  Hematological:  Does not bruise/bleed easily.  All other systems reviewed and are negative.      Objective:   Physical Exam Constitutional:      Appearance: Normal appearance.  Eyes:     Comments: Right lower lid erythema and edema with an area of smal papular lesions.   Cardiovascular:     Rate and Rhythm: Normal rate and regular rhythm.     Heart sounds: Normal heart sounds.  Pulmonary:     Effort: Pulmonary effort is normal.     Breath sounds: Normal breath sounds.  Skin:    General: Skin is warm.  Neurological:     General: No focal deficit present.      Mental Status: She is alert and oriented to person, place, and time.  Psychiatric:        Mood and Affect: Mood normal.        Behavior: Behavior normal.    BP 112/67   Pulse 92   Temp 98.4 F (36.9 C) (Temporal)   Ht 5' 2 (1.575 m)   Wt 151 lb (68.5 kg)   SpO2 98%   BMI 27.62 kg/m         Assessment & Plan:  Candace Torres in today with chief complaint of Right eye swollen and red   1. Edema of right lower eyelid (Primary) Do not think is infectious- try the steroids today and if no better tomorrow can start antibiotic Cool compresses 'avoid rubbing RTO prn - predniSONE  (DELTASONE ) 20 MG tablet; Take 2 tablets (40 mg total) by mouth daily with breakfast for 5 days. 2 po daily for 5 days  Dispense: 10 tablet; Refill: 0 - doxycycline  (VIBRA -TABS) 100 MG tablet; Take 1 tablet (100 mg total) by mouth 2 (two) times daily. 1 po bid  Dispense: 20 tablet; Refill: 0    The above assessment and management plan was discussed with the patient. The patient verbalized understanding of and has agreed to the management plan. Patient is aware to call the clinic if symptoms persist or worsen. Patient is aware when to return to the clinic for a follow-up visit. Patient educated on when it is appropriate to  go to the emergency department.   Mary-Margaret Gladis, FNP

## 2023-11-13 DIAGNOSIS — D631 Anemia in chronic kidney disease: Secondary | ICD-10-CM | POA: Diagnosis not present

## 2023-11-13 DIAGNOSIS — N189 Chronic kidney disease, unspecified: Secondary | ICD-10-CM | POA: Diagnosis not present

## 2023-11-13 DIAGNOSIS — R809 Proteinuria, unspecified: Secondary | ICD-10-CM | POA: Diagnosis not present

## 2023-11-13 DIAGNOSIS — E559 Vitamin D deficiency, unspecified: Secondary | ICD-10-CM | POA: Diagnosis not present

## 2023-11-19 DIAGNOSIS — D3502 Benign neoplasm of left adrenal gland: Secondary | ICD-10-CM | POA: Diagnosis not present

## 2023-11-19 DIAGNOSIS — I5032 Chronic diastolic (congestive) heart failure: Secondary | ICD-10-CM | POA: Diagnosis not present

## 2023-11-19 DIAGNOSIS — K760 Fatty (change of) liver, not elsewhere classified: Secondary | ICD-10-CM | POA: Diagnosis not present

## 2023-11-19 DIAGNOSIS — I129 Hypertensive chronic kidney disease with stage 1 through stage 4 chronic kidney disease, or unspecified chronic kidney disease: Secondary | ICD-10-CM | POA: Diagnosis not present

## 2023-11-27 ENCOUNTER — Encounter: Payer: Self-pay | Admitting: Family

## 2023-11-27 ENCOUNTER — Ambulatory Visit (INDEPENDENT_AMBULATORY_CARE_PROVIDER_SITE_OTHER): Payer: PPO | Admitting: Family

## 2023-11-27 VITALS — BP 120/74 | HR 87 | Temp 97.6°F | Ht 62.0 in | Wt 151.8 lb

## 2023-11-27 DIAGNOSIS — F411 Generalized anxiety disorder: Secondary | ICD-10-CM

## 2023-11-27 DIAGNOSIS — K219 Gastro-esophageal reflux disease without esophagitis: Secondary | ICD-10-CM

## 2023-11-27 DIAGNOSIS — N183 Chronic kidney disease, stage 3 unspecified: Secondary | ICD-10-CM

## 2023-11-27 DIAGNOSIS — F32A Depression, unspecified: Secondary | ICD-10-CM | POA: Diagnosis not present

## 2023-11-27 DIAGNOSIS — E785 Hyperlipidemia, unspecified: Secondary | ICD-10-CM

## 2023-11-27 DIAGNOSIS — E039 Hypothyroidism, unspecified: Secondary | ICD-10-CM

## 2023-11-27 MED ORDER — ROSUVASTATIN CALCIUM 5 MG PO TABS: 5.0000 mg | ORAL_TABLET | Freq: Every day | ORAL | 3 refills | Status: AC

## 2023-11-27 NOTE — Patient Instructions (Signed)
 Dyslipidemia Dyslipidemia is an imbalance of waxy, fat-like substances (lipids) in the blood. The body needs lipids in small amounts. Dyslipidemia often involves a high level of cholesterol or triglycerides, which are types of lipids. Common forms of dyslipidemia include: High levels of LDL cholesterol. LDL is the type of cholesterol that causes fatty deposits (plaques) to build up in the blood vessels that carry blood away from the heart (arteries). Low levels of HDL cholesterol. HDL cholesterol is the type of cholesterol that protects against heart disease. High levels of HDL remove the LDL buildup from arteries. High levels of triglycerides. Triglycerides are a fatty substance in the blood that is linked to a buildup of plaques in the arteries. What are the causes? There are two main types of dyslipidemia: primary and secondary. Primary dyslipidemia is caused by changes (mutations) in genes that are passed down through families (inherited). These mutations cause several types of dyslipidemia. Secondary dyslipidemia may be caused by various risk factors that can lead to the disease, such as lifestyle choices and certain medical conditions. What increases the risk? You are more likely to develop this condition if you are an older man or if you are a woman who has gone through menopause. Other risk factors include: Having a family history of dyslipidemia. Taking certain medicines, including birth control pills, steroids, some diuretics, and beta-blockers. Eating a diet high in saturated fat. Smoking cigarettes or excessive alcohol intake. Having certain medical conditions such as diabetes, polycystic ovary syndrome (PCOS), kidney disease, liver disease, or hypothyroidism. Not exercising regularly. Being overweight or obese with too much belly fat. What are the signs or symptoms? In most cases, dyslipidemia does not usually cause any symptoms. In severe cases, very high lipid levels can  cause: Fatty bumps under the skin (xanthomas). A white or gray ring around the black center (pupil) of the eye. Very high triglyceride levels can cause inflammation of the pancreas (pancreatitis). How is this diagnosed? Your health care provider may diagnose dyslipidemia based on a routine blood test (fasting blood test). Because most people do not have symptoms of the condition, this blood testing (lipid profile) is done on adults age 69 and older and is repeated every 4-6 years. This test checks: Total cholesterol. This measures the total amount of cholesterol in your blood, including LDL cholesterol, HDL cholesterol, and triglycerides. A healthy number is below 200 mg/dL (4.82 mmol/L). LDL cholesterol. The target number for LDL cholesterol is different for each person, depending on individual risk factors. A healthy number is usually below 100 mg/dL (7.40 mmol/L). Ask your health care provider what your LDL cholesterol should be. HDL cholesterol. An HDL level of 60 mg/dL (8.44 mmol/L) or higher is best because it helps to protect against heart disease. A number below 40 mg/dL (8.96 mmol/L) for men or below 50 mg/dL (8.70 mmol/L) for women increases the risk for heart disease. Triglycerides. A healthy triglyceride number is below 150 mg/dL (8.30 mmol/L). If your lipid profile is abnormal, your health care provider may do other blood tests. How is this treated? Treatment depends on the type of dyslipidemia that you have and your other risk factors for heart disease and stroke. Your health care provider will have a target range for your lipid levels based on this information. Treatment for dyslipidemia starts with lifestyle changes, such as diet and exercise. Your health care provider may recommend that you: Get regular exercise. Make changes to your diet. Quit smoking if you smoke. Limit your alcohol intake. If diet  changes and exercise do not help you reach your goals, your health care provider  may also prescribe medicine to lower lipids. The most commonly prescribed type of medicine lowers your LDL cholesterol (statin drug). If you have a high triglyceride level, your provider may prescribe another type of drug (fibrate) or an omega-3 fish oil supplement, or both. Follow these instructions at home: Eating and drinking  Follow instructions from your health care provider or dietitian about eating or drinking restrictions. Eat a healthy diet as told by your health care provider. This can help you reach and maintain a healthy weight, lower your LDL cholesterol, and raise your HDL cholesterol. This may include: Limiting your calories, if you are overweight. Eating more fruits, vegetables, whole grains, fish, and lean meats. Limiting saturated fat, trans fat, and cholesterol. Do not drink alcohol if: Your health care provider tells you not to drink. You are pregnant, may be pregnant, or are planning to become pregnant. If you drink alcohol: Limit how much you have to: 0-1 drink a day for women. 0-2 drinks a day for men. Know how much alcohol is in your drink. In the [place] one drink equals one 12 oz bottle of beer (355 mL), one 5 oz glass of wine (148 mL), or one 1 oz glass of hard liquor (44 mL). Activity Get regular exercise. Start an exercise and strength training program as told by your health care provider. Ask your health care provider what activities are safe for you. Your health care provider may recommend: 30 minutes of aerobic activity 4-6 days a week. Brisk walking is an example of aerobic activity. Strength training 2 days a week. General instructions Do not use any products that contain nicotine or tobacco. These products include cigarettes, chewing tobacco, and vaping devices, such as e-cigarettes. If you need help quitting, ask your health care provider. Take over-the-counter and prescription medicines only as told by your health care provider. This includes  supplements. Keep all follow-up visits. This is important. Contact a health care provider if: You are having trouble sticking to your exercise or diet plan. You are struggling to quit smoking or to control your use of alcohol. Summary Dyslipidemia often involves a high level of cholesterol or triglycerides, which are types of lipids. Treatment depends on the type of dyslipidemia that you have and your other risk factors for heart disease and stroke. Treatment for dyslipidemia starts with lifestyle changes, such as diet and exercise. Your health care provider may prescribe medicine to lower lipids. This information is not intended to replace advice given to you by your health care provider. Make sure you discuss any questions you have with your health care provider. Document Revised: 10/21/2021 Document Reviewed: 05/24/2020 Elsevier Patient Education  2025 [employer].

## 2023-11-27 NOTE — Progress Notes (Signed)
 Subjective:    Patient ID: Candace Torres, female    DOB: [birth year], 68 y.o.   MRN: [id number]  Chief Complaint  Patient presents with   Medical Management of Chronic Issues   PT presents to the office today for chronic follow up.  She is followed by Nephrologists for CKD.  Gastroesophageal Reflux She complains of belching and heartburn. She reports no dysphagia or no hoarse voice. This is a chronic problem. The current episode started more than 1 year ago. The problem occurs rarely. The problem has been resolved. The symptoms are aggravated by certain foods. Pertinent negatives include no fatigue. She has tried a PPI for the symptoms. The treatment provided moderate relief.  Thyroid  Problem Presents for follow-up visit. Symptoms include anxiety. Patient reports no diarrhea, dry skin, fatigue or hoarse voice. The symptoms have been stable. Her past medical history is significant for hyperlipidemia.  Anxiety Presents for follow-up visit. Symptoms include excessive worry, irritability and nervous/anxious behavior. Symptoms occur occasionally. The severity of symptoms is mild.    Depression        This is a chronic problem.  The current episode started more than 1 year ago.   The problem occurs intermittently.  Associated symptoms include no fatigue, no helplessness, no hopelessness and not sad.  Past treatments include SNRIs - Serotonin and norepinephrine reuptake inhibitors.  Past medical history includes thyroid  problem and anxiety.   Hyperlipidemia This is a chronic problem. The current episode started more than 1 year ago. The problem is uncontrolled. Recent lipid tests were reviewed and are high. Current antihyperlipidemic treatment includes diet change and statins. The current treatment provides moderate improvement of lipids.      Review of Systems  Constitutional:  Positive for irritability. Negative for fatigue.  HENT:  Negative for hoarse voice.   Gastrointestinal:   Positive for heartburn. Negative for diarrhea and dysphagia.  Psychiatric/Behavioral:  The patient is nervous/anxious.   All other systems reviewed and are negative.      Family History  Problem Relation Age of Onset   Depression Mother    Hypertension Mother    COPD Father    Colon cancer Neg Hx    Colon polyps Neg Hx    Social History   Socioeconomic History   Marital status: Married    Spouse name: Not on file   Number of children: Not on file   Years of education: Not on file   Highest education level: Not on file  Occupational History   Not on file  Tobacco Use   Smoking status: Former   Smokeless tobacco: Never   Tobacco comments:    Quit 10 years.    Vaping Use   Vaping status: Never Used  Substance and Sexual Activity   Alcohol use: No   Drug use: No   Sexual activity: Not on file  Other Topics Concern   Not on file  Social History Narrative   Lives at home with husband.  One son and three grands.     Social Drivers of Corporate investment banker Strain: Low Risk  (08/28/2023)   Overall Financial Resource Strain (CARDIA)    Difficulty of Paying Living Expenses: Not hard at all  Food Insecurity: No Food Insecurity (08/28/2023)   Hunger Vital Sign    Worried About Running Out of Food in the Last Year: Never true    Ran Out of Food in the Last Year: Never true  Transportation Needs: No Transportation Needs (08/28/2023)  PRAPARE - Administrator, Civil Service (Medical): No    Lack of Transportation (Non-Medical): No  Physical Activity: Inactive (08/28/2023)   Exercise Vital Sign    Days of Exercise per Week: 0 days    Minutes of Exercise per Session: 0 min  Stress: No Stress Concern Present (08/28/2023)   [employer] of Occupational Health - Occupational Stress Questionnaire    Feeling of Stress : Not at all  Social Connections: Moderately Isolated (08/28/2023)   Social Connection and Isolation Panel    Frequency of Communication with  Friends and Family: More than three times a week    Frequency of Social Gatherings with Friends and Family: More than three times a week    Attends Religious Services: Never    Database administrator or Organizations: No    Attends Banker Meetings: Never    Marital Status: Married    Objective:   Physical Exam Vitals reviewed.  Constitutional:      General: She is not in acute distress.    Appearance: She is well-developed.  HENT:     Head: Normocephalic and atraumatic.     Right Ear: Tympanic membrane normal.     Left Ear: Tympanic membrane normal.  Eyes:     Pupils: Pupils are equal, round, and reactive to light.  Neck:     Thyroid : No thyromegaly.  Cardiovascular:     Rate and Rhythm: Normal rate and regular rhythm.     Heart sounds: Normal heart sounds. No murmur heard. Pulmonary:     Effort: Pulmonary effort is normal. No respiratory distress.     Breath sounds: Normal breath sounds. No wheezing.  Abdominal:     General: Bowel sounds are normal. There is no distension.     Palpations: Abdomen is soft.     Tenderness: There is no abdominal tenderness.  Musculoskeletal:        General: No tenderness. Normal range of motion.     Cervical back: Normal range of motion and neck supple.  Skin:    General: Skin is warm and dry.  Neurological:     Mental Status: She is alert and oriented to person, place, and time.     Cranial Nerves: No cranial nerve deficit.     Deep Tendon Reflexes: Reflexes are normal and symmetric.  Psychiatric:        Behavior: Behavior normal.        Thought Content: Thought content normal.        Judgment: Judgment normal.       BP 120/74   Pulse 87   Temp 97.6 F (36.4 C) (Temporal)   Ht 5' 2 (1.575 m)   Wt 151 lb 12.8 oz (68.9 kg)   BMI 27.76 kg/m        Assessment & Plan:  Candace Torres comes in today with chief complaint of Medical Management of Chronic Issues   Diagnosis and orders addressed:  1.  Gastroesophageal reflux disease, unspecified whether esophagitis present (Primary) - CMP14+EGFR  2. Depression, unspecified depression type - CMP14+EGFR  3. GAD (generalized anxiety disorder) - CMP14+EGFR  4. Stage 3 chronic kidney disease, unspecified whether stage 3a or 3b CKD (HCC) - CMP14+EGFR  5. Hypothyroidism, unspecified type - CMP14+EGFR - TSH  6. Hyperlipidemia, unspecified hyperlipidemia type - rosuvastatin  (CRESTOR ) 5 MG tablet; Take 1 tablet (5 mg total) by mouth daily.  Dispense: 90 tablet; Refill: 3 - CMP14+EGFR   Labs pending Continue current medications  Keep follow up with nephrologists Health Maintenance reviewed Diet and exercise encouraged  Follow up plan: 6 months   Bari Learn, FNP

## 2023-11-28 LAB — CMP14+EGFR
ALT: 21 IU/L (ref 0–32)
AST: 19 IU/L (ref 0–40)
Albumin: 4.5 g/dL (ref 3.9–4.9)
Alkaline Phosphatase: 86 IU/L (ref 44–121)
BUN/Creatinine Ratio: 11 — ABNORMAL LOW (ref 12–28)
BUN: 17 mg/dL (ref 8–27)
Bilirubin Total: 0.5 mg/dL (ref 0.0–1.2)
CO2: 22 mmol/L (ref 20–29)
Calcium: 10 mg/dL (ref 8.7–10.3)
Chloride: 101 mmol/L (ref 96–106)
Creatinine, Ser: 1.53 mg/dL — ABNORMAL HIGH (ref 0.57–1.00)
Globulin, Total: 3 g/dL (ref 1.5–4.5)
Glucose: 97 mg/dL (ref 70–99)
Potassium: 4.8 mmol/L (ref 3.5–5.2)
Sodium: 139 mmol/L (ref 134–144)
Total Protein: 7.5 g/dL (ref 6.0–8.5)
eGFR: 36 mL/min/1.73 — ABNORMAL LOW (ref >59)

## 2023-11-28 LAB — TSH: TSH: 4.56 u[IU]/mL — ABNORMAL HIGH (ref 0.450–4.500)

## 2023-11-29 ENCOUNTER — Ambulatory Visit: Payer: Self-pay | Admitting: Family

## 2024-01-07 ENCOUNTER — Other Ambulatory Visit: Payer: Self-pay | Admitting: Family

## 2024-01-07 ENCOUNTER — Ambulatory Visit (INDEPENDENT_AMBULATORY_CARE_PROVIDER_SITE_OTHER)

## 2024-01-07 ENCOUNTER — Other Ambulatory Visit

## 2024-01-07 DIAGNOSIS — Z780 Asymptomatic menopausal state: Secondary | ICD-10-CM | POA: Diagnosis not present

## 2024-01-07 DIAGNOSIS — Z13820 Encounter for screening for osteoporosis: Secondary | ICD-10-CM

## 2024-01-09 DIAGNOSIS — M8589 Other specified disorders of bone density and structure, multiple sites: Secondary | ICD-10-CM | POA: Diagnosis not present

## 2024-01-09 DIAGNOSIS — Z780 Asymptomatic menopausal state: Secondary | ICD-10-CM | POA: Diagnosis not present

## 2024-01-10 ENCOUNTER — Ambulatory Visit: Payer: Self-pay | Admitting: Family

## 2024-03-05 DIAGNOSIS — I10 Essential (primary) hypertension: Secondary | ICD-10-CM | POA: Diagnosis not present

## 2024-03-05 DIAGNOSIS — D631 Anemia in chronic kidney disease: Secondary | ICD-10-CM | POA: Diagnosis not present

## 2024-03-05 DIAGNOSIS — E211 Secondary hyperparathyroidism, not elsewhere classified: Secondary | ICD-10-CM | POA: Diagnosis not present

## 2024-03-05 DIAGNOSIS — R809 Proteinuria, unspecified: Secondary | ICD-10-CM | POA: Diagnosis not present

## 2024-03-05 DIAGNOSIS — N189 Chronic kidney disease, unspecified: Secondary | ICD-10-CM | POA: Diagnosis not present

## 2024-03-13 DIAGNOSIS — K760 Fatty (change of) liver, not elsewhere classified: Secondary | ICD-10-CM | POA: Diagnosis not present

## 2024-03-13 DIAGNOSIS — I5032 Chronic diastolic (congestive) heart failure: Secondary | ICD-10-CM | POA: Diagnosis not present

## 2024-03-13 DIAGNOSIS — I129 Hypertensive chronic kidney disease with stage 1 through stage 4 chronic kidney disease, or unspecified chronic kidney disease: Secondary | ICD-10-CM | POA: Diagnosis not present

## 2024-03-13 DIAGNOSIS — N1832 Chronic kidney disease, stage 3b: Secondary | ICD-10-CM | POA: Diagnosis not present

## 2024-04-02 ENCOUNTER — Encounter: Payer: Self-pay | Admitting: Family Medicine

## 2024-04-02 ENCOUNTER — Ambulatory Visit (INDEPENDENT_AMBULATORY_CARE_PROVIDER_SITE_OTHER): Admitting: Family Medicine

## 2024-04-02 VITALS — BP 126/74 | HR 106 | Temp 98.1°F | Ht 62.0 in | Wt 151.2 lb

## 2024-04-02 DIAGNOSIS — N3001 Acute cystitis with hematuria: Secondary | ICD-10-CM

## 2024-04-02 DIAGNOSIS — N898 Other specified noninflammatory disorders of vagina: Secondary | ICD-10-CM | POA: Diagnosis not present

## 2024-04-02 LAB — URINALYSIS, ROUTINE W REFLEX MICROSCOPIC
Glucose, UA: NEGATIVE
Nitrite, UA: POSITIVE — AB
Specific Gravity, UA: 1.025 (ref 1.005–1.030)
Urobilinogen, Ur: 1 mg/dL (ref 0.2–1.0)
pH, UA: 6 (ref 5.0–7.5)

## 2024-04-02 LAB — MICROSCOPIC EXAMINATION
Epithelial Cells (non renal): NONE SEEN /HPF (ref 0–10)
Renal Epithel, UA: NONE SEEN /HPF
WBC, UA: >30 /HPF — AB (ref 0–5)

## 2024-04-02 MED ORDER — CEPHALEXIN 500 MG PO CAPS: 500.0000 mg | ORAL_CAPSULE | Freq: Two times a day (BID) | ORAL | 0 refills | Status: AC

## 2024-04-02 MED ORDER — FLUCONAZOLE 150 MG PO TABS: ORAL_TABLET | ORAL | 0 refills | Status: AC

## 2024-04-02 NOTE — Progress Notes (Signed)
 "  Acute Office Visit  Subjective:     Patient ID: Candace Torres, female    DOB: 1955-11-10, 68 y.o.   MRN: [id number]  Chief Complaint  Patient presents with   Dysuria    HPI  History of Present Illness   Candace Torres is a 68 year old female with recurrent urinary tract infections who presents with urinary symptoms suggestive of a UTI.  Lower urinary tract symptoms - Increased urinary frequency and urgency for the past week - Sensation of needing to urinate immediately and frequently - Dysuria described as a burning sensation during urination - No visible hematuria - No flank pain  Associated symptoms - Mild vaginal pruritus - No fever, chills, nausea, or vomiting  Symptom management - Azo used for symptom relief, last dose taken yesterday morning - No improvement in symptoms since onset       ROS As per HPI.      Objective:    BP 126/74   Pulse (!) 106   Temp 98.1 F (36.7 C)   Ht 5' 2 (1.575 m)   Wt 151 lb 3.2 oz (68.6 kg)   SpO2 96%   BMI 27.65 kg/m    Physical Exam Vitals and nursing note reviewed.  Constitutional:      General: She is not in acute distress.    Appearance: She is not ill-appearing, toxic-appearing or diaphoretic.  Cardiovascular:     Rate and Rhythm: Regular rhythm.     Heart sounds: Normal heart sounds. No murmur heard. Abdominal:     General: Bowel sounds are normal. There is no distension.     Tenderness: There is no abdominal tenderness. There is no right CVA tenderness, left CVA tenderness, guarding or rebound.  Musculoskeletal:     Right lower leg: No edema.     Left lower leg: No edema.  Skin:    General: Skin is warm and dry.  Neurological:     General: No focal deficit present.     Mental Status: She is alert and oriented to person, place, and time.  Psychiatric:        Mood and Affect: Mood normal.        Behavior: Behavior normal.     Urine dipstick shows positive for protein, positive for  nitrates, positive for leukocytes, and positive for ketones.  Micro exam: >30 WBC's per HPF, 11-30 RBC's per HPF, and moderate + bacteria. Yeast detected.      Assessment & Plan:   Zauria was seen today for dysuria.  Diagnoses and all orders for this visit:  Acute cystitis with hematuria -     Urinalysis, Routine w reflex microscopic -     Urine Culture -     Urinalysis, Routine w reflex microscopic; Future -     cephALEXin  (KEFLEX ) 500 MG capsule; Take 1 capsule (500 mg total) by mouth 2 (two) times daily for 7 days.  Vaginal itching -     fluconazole (DIFLUCAN) 150 MG tablet; Take one tablet by mouth once and repeat in 1 week.   Assessment and Plan    Acute cystitis with hematuria Symptoms and urinalysis confirm UTI with hematuria. Awaiting urine culture for antibiotic sensitivity. - Prescribed cefalexin 500 mg twice daily for 7 days. - Sent urine for culture to confirm antibiotic sensitivity. - Advised to return if symptoms do not improve in 2-3 days. - Ordered repeat urinalysis in 3 weeks to ensure resolution of hematuria.  Vaginal candidiasis Mild vaginal itching  likely due to yeast as yeast was detected on micro.  - Prescribed fluconazole 150 mg, take one dose now and another dose in one week.      Return to office for new or worsening symptoms, or if symptoms persist.   The patient indicates understanding of these issues and agrees with the plan.  Candace CHRISTELLA Search, FNP   "

## 2024-04-07 ENCOUNTER — Ambulatory Visit: Payer: Self-pay | Admitting: Family Medicine

## 2024-04-07 LAB — URINE CULTURE

## 2024-04-23 ENCOUNTER — Other Ambulatory Visit

## 2024-05-29 ENCOUNTER — Ambulatory Visit: Payer: Self-pay | Admitting: Family
